# Patient Record
Sex: Male | Born: 1940
Health system: Southern US, Community
[De-identification: ages and names within clinical notes are randomized; demographics above are authoritative.]

## PROBLEM LIST (undated history)

## (undated) DIAGNOSIS — R51 Headache: Secondary | ICD-10-CM

## (undated) DIAGNOSIS — E785 Hyperlipidemia, unspecified: Secondary | ICD-10-CM

## (undated) DIAGNOSIS — R519 Headache, unspecified: Secondary | ICD-10-CM

## (undated) DIAGNOSIS — C189 Malignant neoplasm of colon, unspecified: Secondary | ICD-10-CM

## (undated) DIAGNOSIS — C801 Malignant (primary) neoplasm, unspecified: Secondary | ICD-10-CM

## (undated) DIAGNOSIS — D329 Benign neoplasm of meninges, unspecified: Secondary | ICD-10-CM

## (undated) HISTORY — DX: Headache: R51

## (undated) HISTORY — DX: Malignant (primary) neoplasm, unspecified: C80.1

## (undated) HISTORY — DX: Headache, unspecified: R51.9

## (undated) HISTORY — DX: Benign neoplasm of meninges, unspecified: D32.9

## (undated) HISTORY — DX: Malignant neoplasm of colon, unspecified: C18.9

## (undated) HISTORY — PX: EYE SURGERY: SHX253

## (undated) HISTORY — DX: Hyperlipidemia, unspecified: E78.5

---

## 1998-08-27 ENCOUNTER — Ambulatory Visit (HOSPITAL_COMMUNITY): Admission: RE | Admit: 1998-08-27 | Discharge: 1998-08-27 | Payer: Self-pay | Admitting: Family Medicine

## 1998-08-27 ENCOUNTER — Encounter: Payer: Self-pay | Admitting: Family Medicine

## 1998-09-25 ENCOUNTER — Ambulatory Visit (HOSPITAL_COMMUNITY): Admission: RE | Admit: 1998-09-25 | Discharge: 1998-09-25 | Payer: Self-pay | Admitting: Gastroenterology

## 1998-09-25 ENCOUNTER — Encounter: Payer: Self-pay | Admitting: Gastroenterology

## 1998-12-08 HISTORY — PX: LIVER RESECTION: SHX1977

## 1998-12-08 HISTORY — PX: COLON RESECTION: SHX5231

## 1998-12-12 ENCOUNTER — Ambulatory Visit (HOSPITAL_COMMUNITY): Admission: RE | Admit: 1998-12-12 | Discharge: 1998-12-12 | Payer: Self-pay | Admitting: Gastroenterology

## 1998-12-20 ENCOUNTER — Ambulatory Visit (HOSPITAL_COMMUNITY): Admission: RE | Admit: 1998-12-20 | Discharge: 1998-12-20 | Payer: Self-pay | Admitting: Gastroenterology

## 1998-12-20 ENCOUNTER — Encounter: Payer: Self-pay | Admitting: Gastroenterology

## 1999-09-03 ENCOUNTER — Emergency Department (HOSPITAL_COMMUNITY): Admission: EM | Admit: 1999-09-03 | Discharge: 1999-09-03 | Payer: Self-pay | Admitting: Emergency Medicine

## 1999-09-03 ENCOUNTER — Encounter: Payer: Self-pay | Admitting: Emergency Medicine

## 2003-12-09 HISTORY — PX: INSERTION PROSTATE RADIATION SEED: SUR718

## 2009-11-03 ENCOUNTER — Emergency Department (HOSPITAL_BASED_OUTPATIENT_CLINIC_OR_DEPARTMENT_OTHER): Admission: EM | Admit: 2009-11-03 | Discharge: 2009-11-03 | Payer: Self-pay | Admitting: Emergency Medicine

## 2009-11-03 ENCOUNTER — Ambulatory Visit: Payer: Self-pay | Admitting: Diagnostic Radiology

## 2009-11-12 ENCOUNTER — Encounter: Admission: RE | Admit: 2009-11-12 | Discharge: 2009-11-12 | Payer: Self-pay | Admitting: Family Medicine

## 2009-11-19 ENCOUNTER — Encounter: Admission: RE | Admit: 2009-11-19 | Discharge: 2009-11-19 | Payer: Self-pay | Admitting: Family Medicine

## 2010-08-27 IMAGING — CT CT ANGIO CHEST
2 of 7 series · 19 of 36 positions shown · IV contrast (CONTRAST)
Comparison: Chest x-ray 11/03/2009

CLINICAL DATA: Elevated D-dimer with chest pain.  History of
cancer.

CT ANGIOGRAPHY CHEST WITH CONTRAST
TECHNIQUE: Multidetector CT imaging of the chest was performed
using the standard protocol during bolus administration of
intravenous contrast. Multiplanar CT image reconstructions
including MIPs were obtained to evaluate the vascular anatomy.
Contrast: 150 ml Omnipaque 350

[Series 6: thins for pacs · axial · 0.89mm/px · z∈[+789,+1112]mm · 18 of 363 slices shown]
[im 20/363  lung]
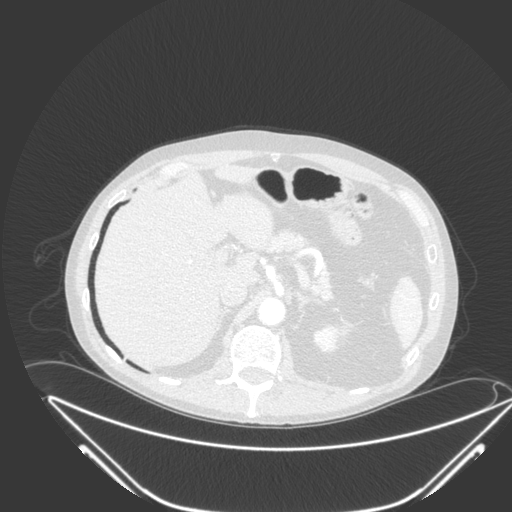
[im 39/363  mediastinal]
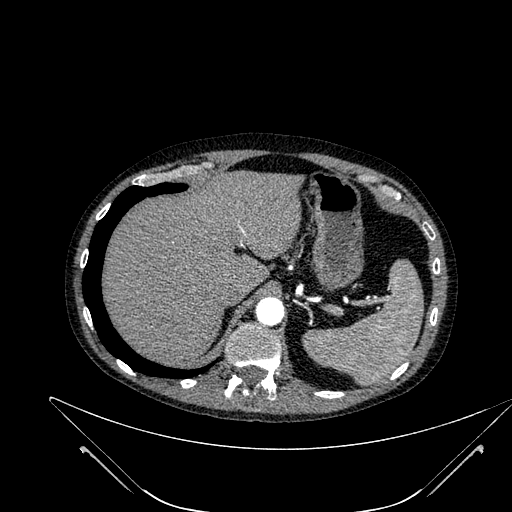
[im 58/363  lung]
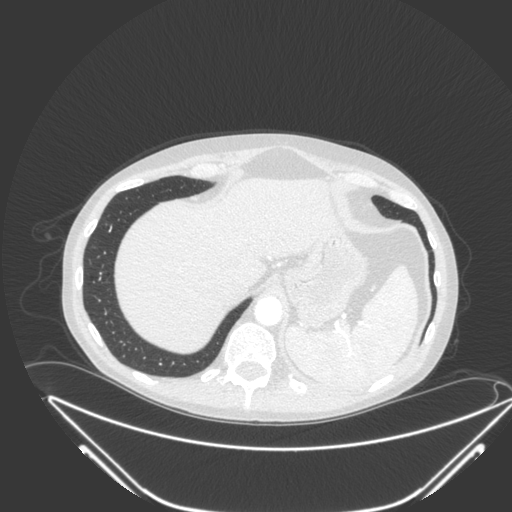
[im 77/363  mediastinal]
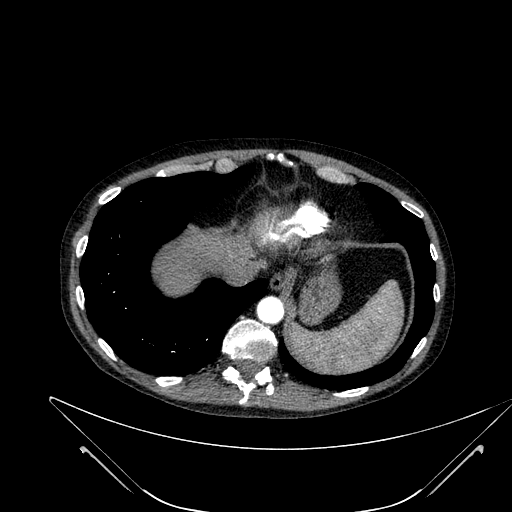
[im 96/363  lung]
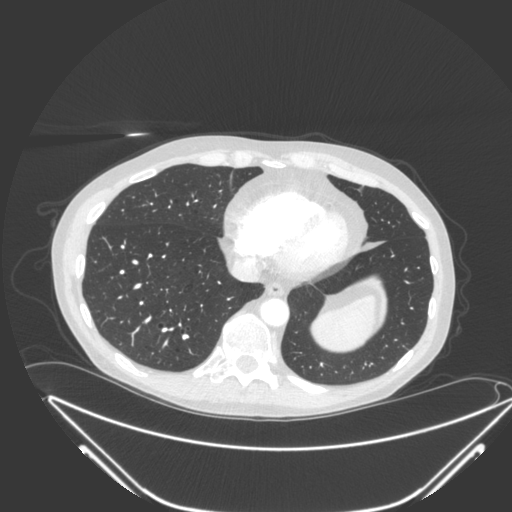
[im 115/363  mediastinal]
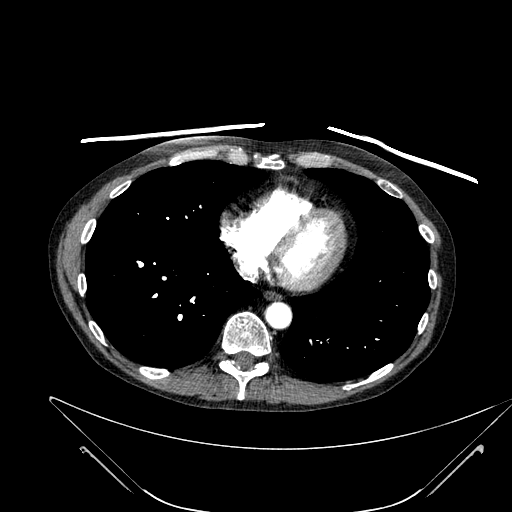
[im 134/363  lung]
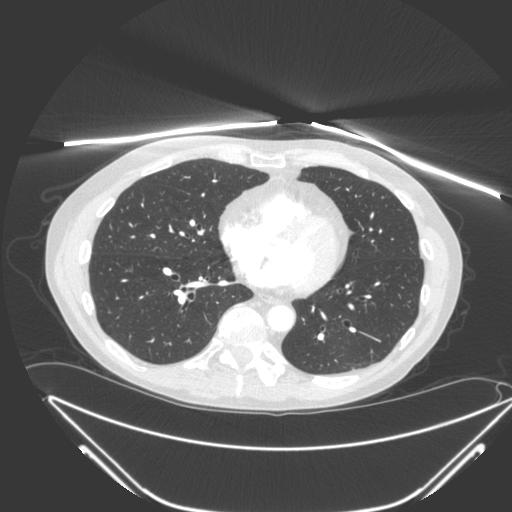
[im 153/363  mediastinal]
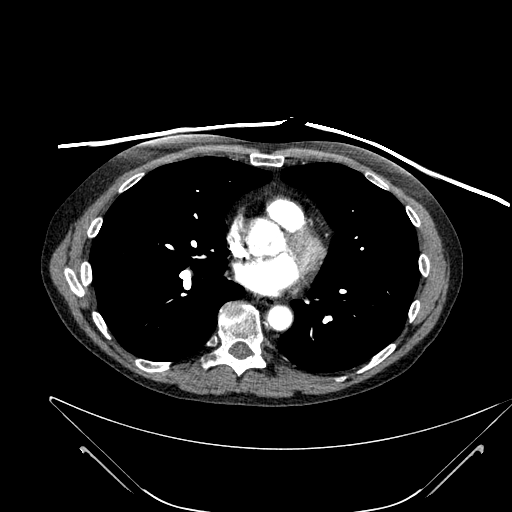
[im 172/363  lung]
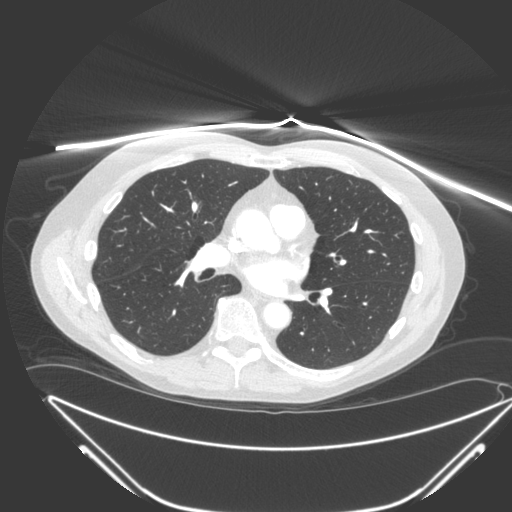
[im 191/363  mediastinal]
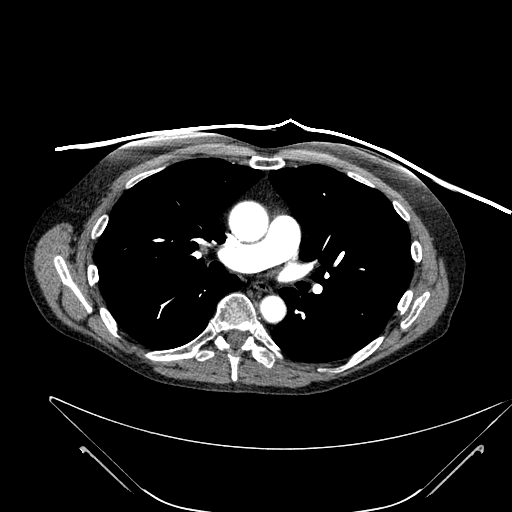
[im 210/363  lung]
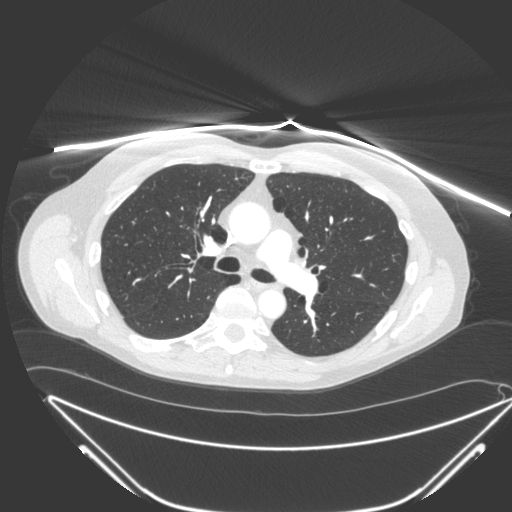
[im 229/363  mediastinal]
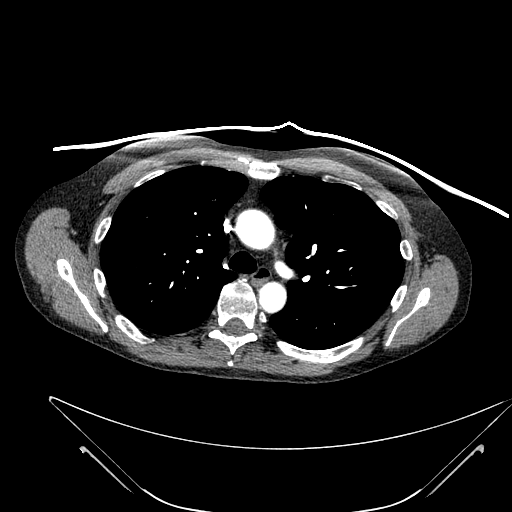
[im 248/363  lung]
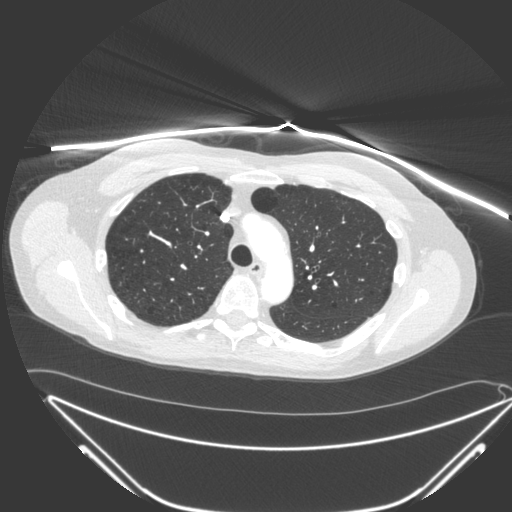
[im 267/363  mediastinal]
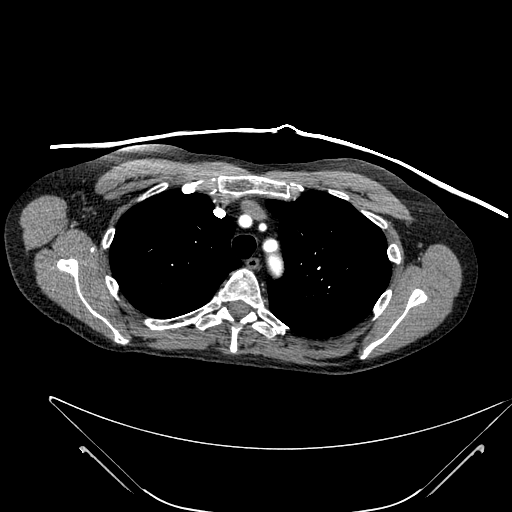
[im 286/363  lung]
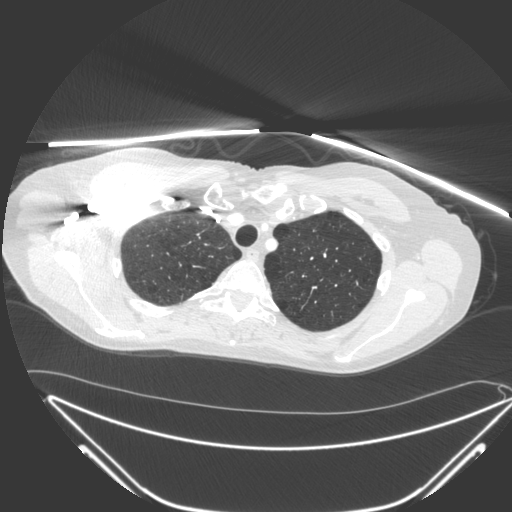
[im 305/363  mediastinal]
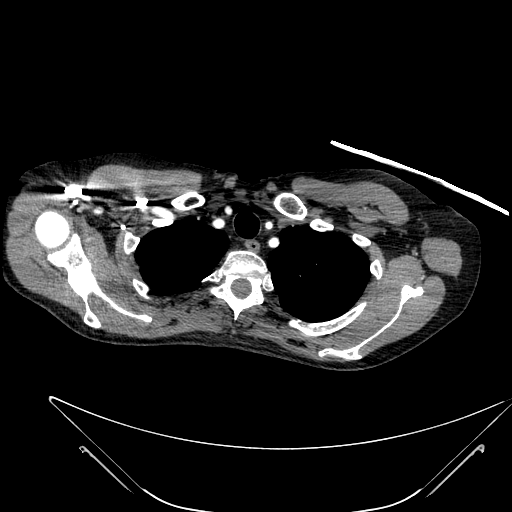
[im 324/363  lung]
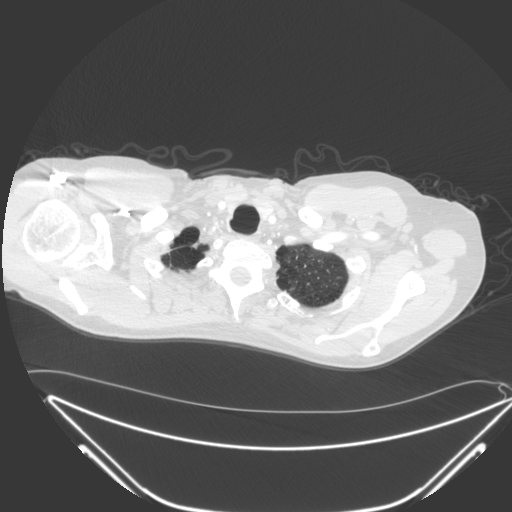
[im 343/363  mediastinal]
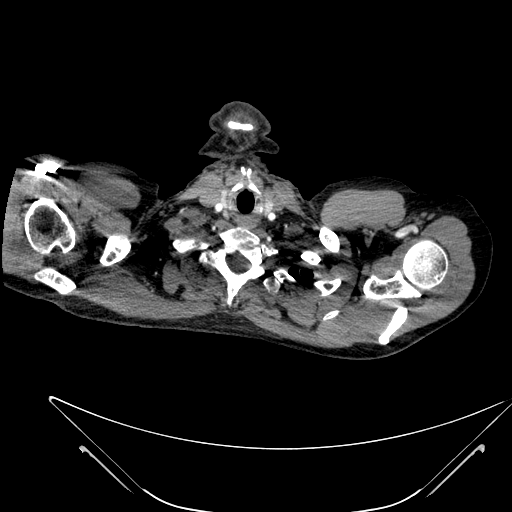

[mpr coronals · coronal · 0.89mm/px · 1 of 96 slices shown]
[im 48/96  mediastinal]
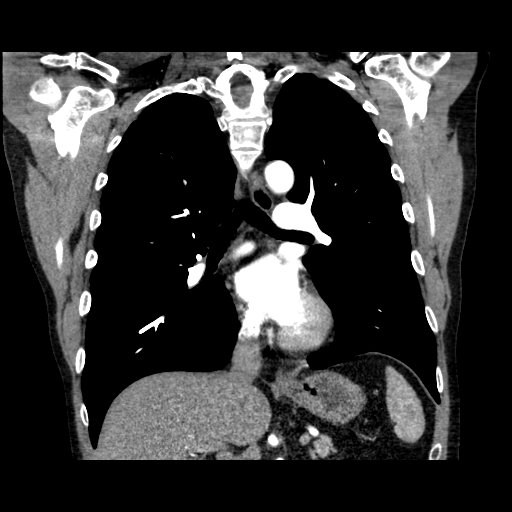

[19 of 36 positions shown; findings below may reference images not displayed]

FINDINGS: No pulmonary embolus.  No pathologically enlarged
mediastinal, hilar or axillary lymph nodes.  Heart size normal.  No
pericardial effusion.

Paraseptal and centrilobular emphysema.  There are a few scattered
areas of subpleural scarring.  Lungs are otherwise clear.  No
pleural fluid.  Airway is unremarkable.

Incidental imaging of the upper abdomen shows a possible 2 cm stone
in the neck of the gallbladder, which is incompletely imaged.  No
worrisome lytic or sclerotic lesions.

Review of the MIP images confirms the above findings.
IMPRESSION: 1.  No pulmonary embolus.
2.  No CT findings to explain the patient's chest pain.

## 2010-09-03 IMAGING — US US ABDOMEN COMPLETE
1 series · 14 of 25 positions shown · non-contrast
Comparison: None.

CLINICAL DATA: Elevated liver function tests.

COMPLETE ABDOMINAL ULTRASOUND

[Series 1: us abdomen complete · 0.28mm/px · 14 of 75 slices shown]
[im 1/75]
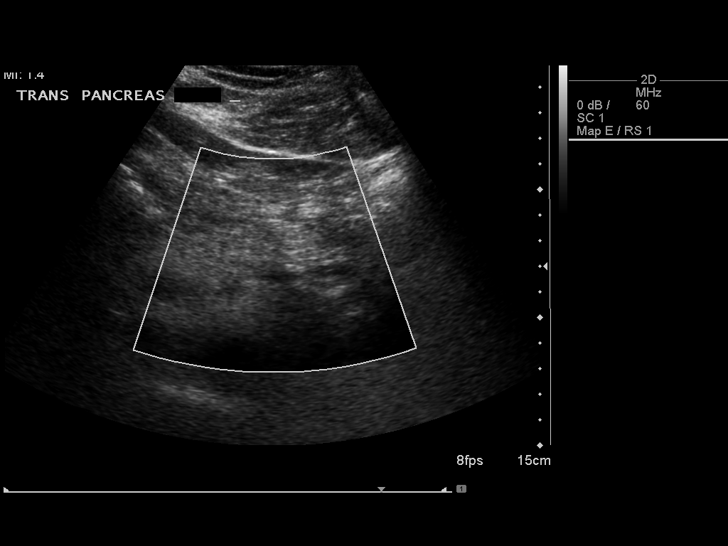
[im 7/75]
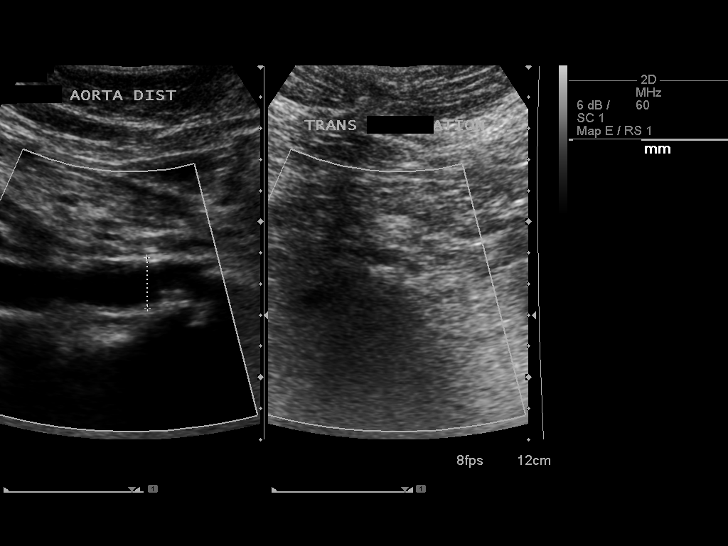
[im 13/75]
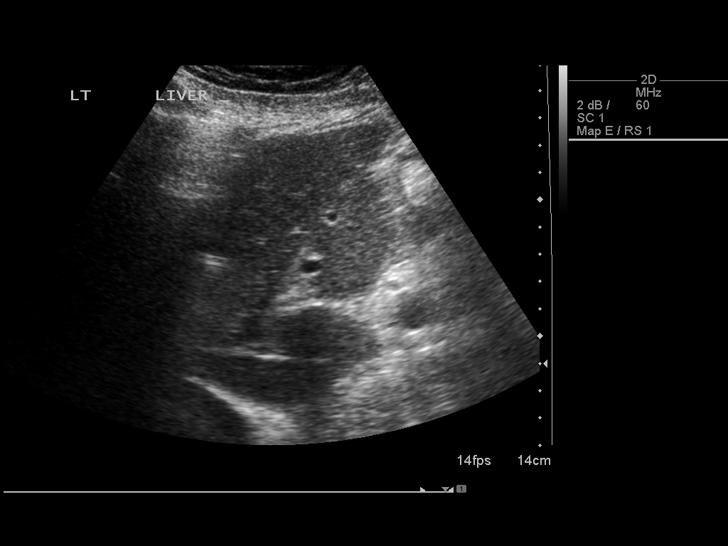
[im 19/75]
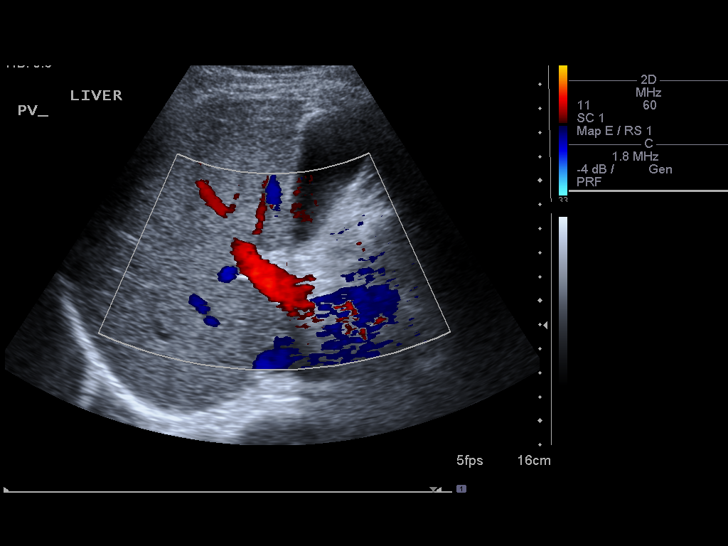
[im 25/75]
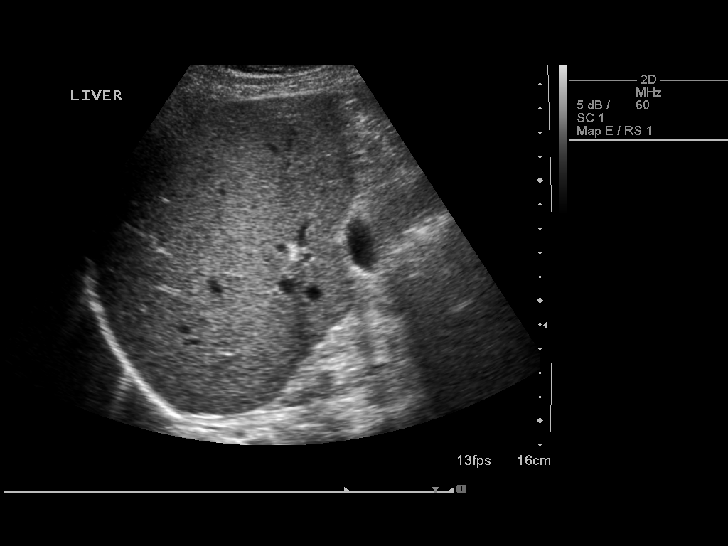
[im 28/75]
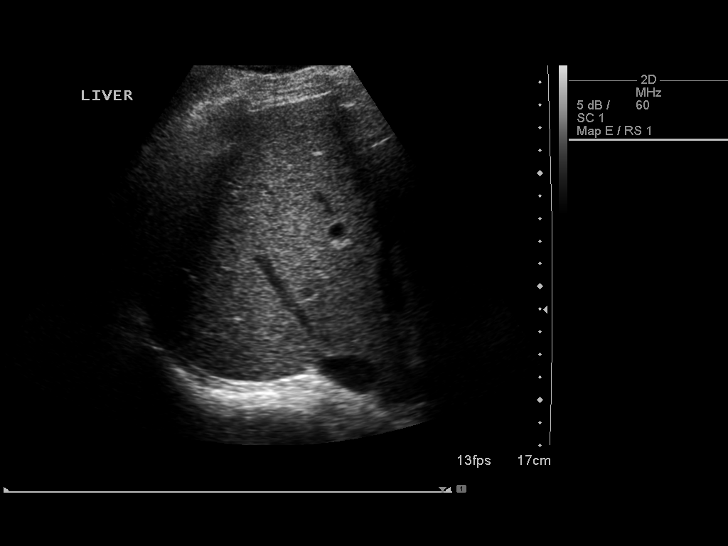
[im 34/75]
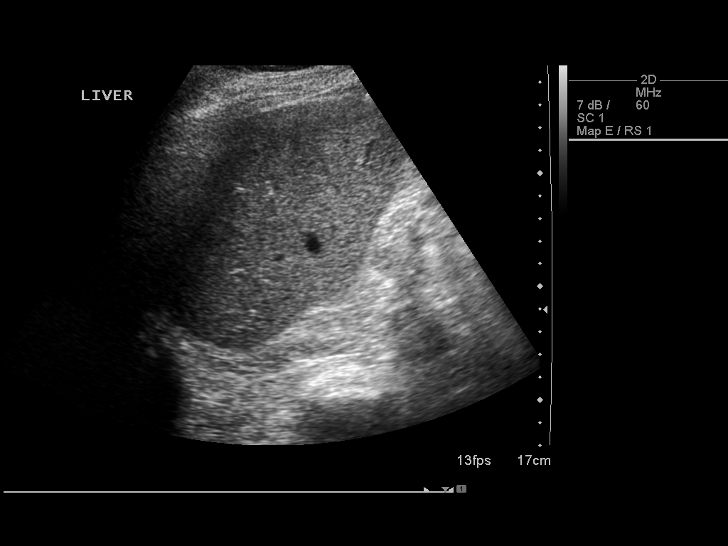
[im 41/75]
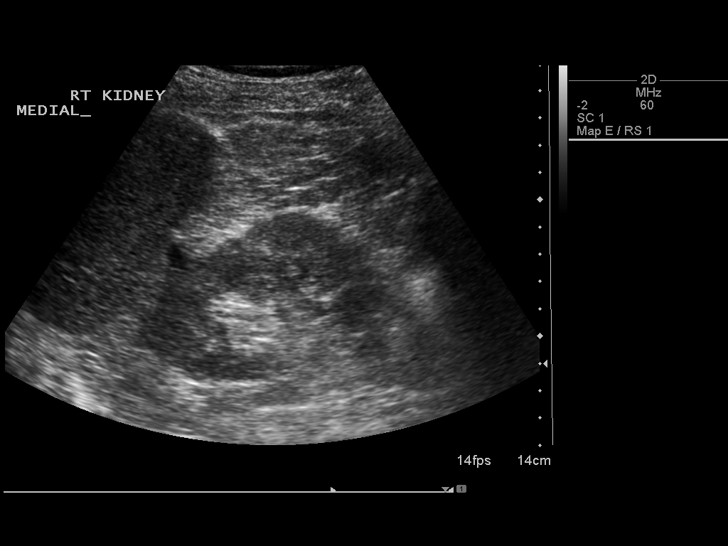
[im 47/75]
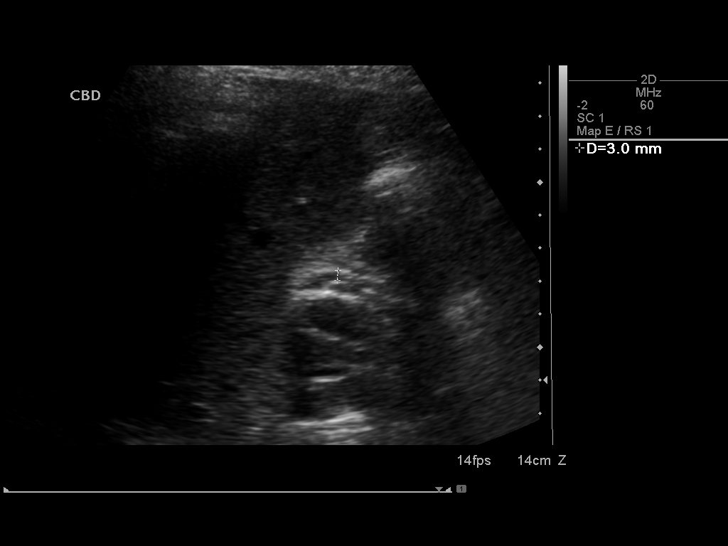
[im 50/75]
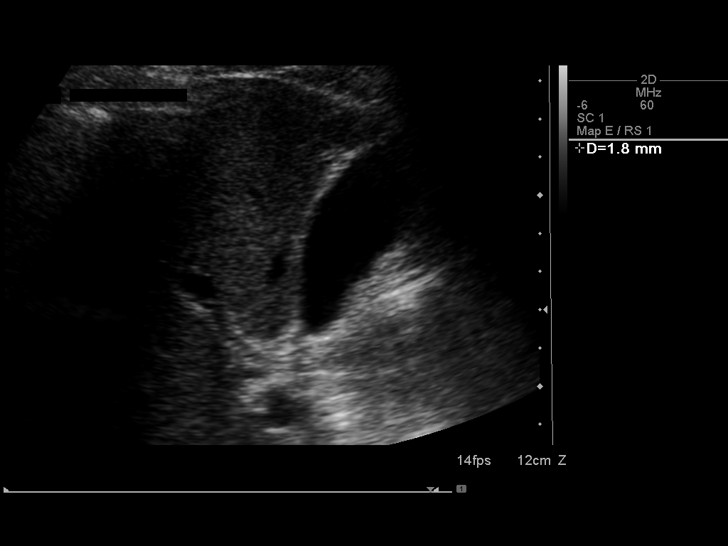
[im 56/75]
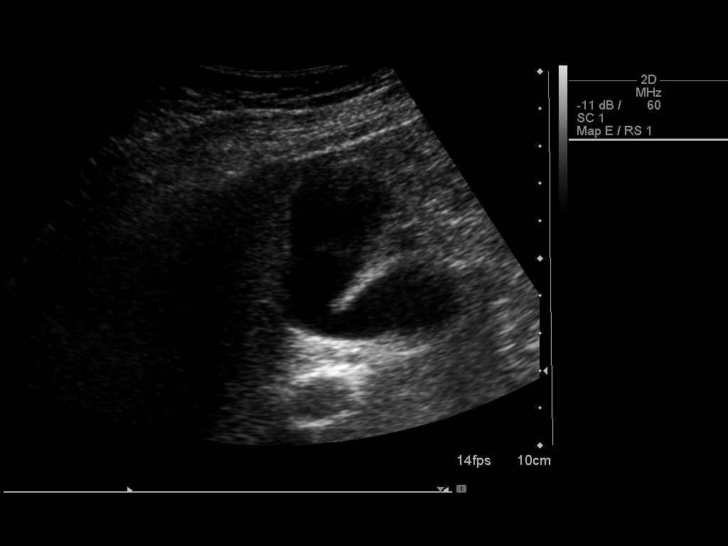
[im 62/75]
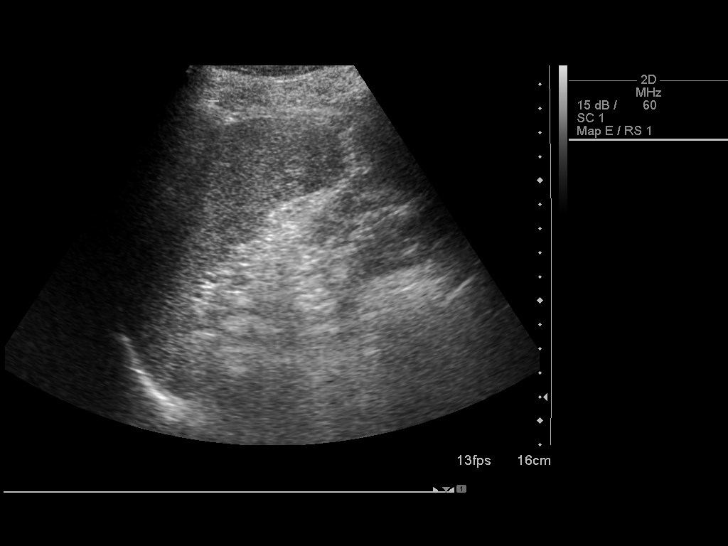
[im 68/75]
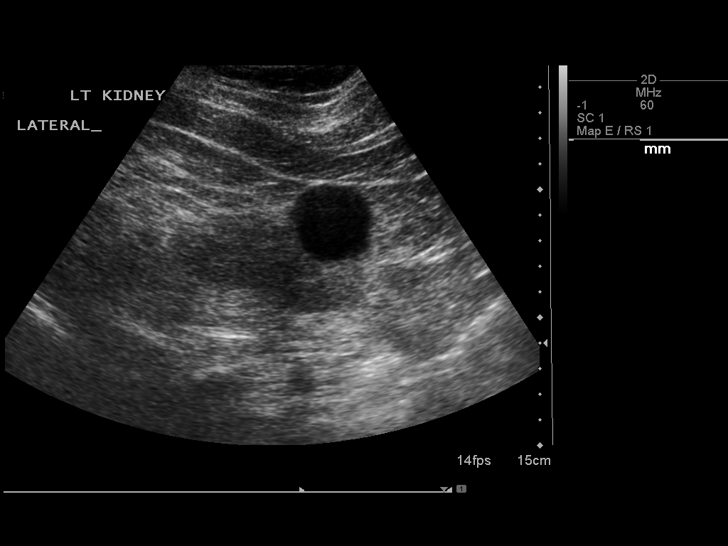
[im 75/75]
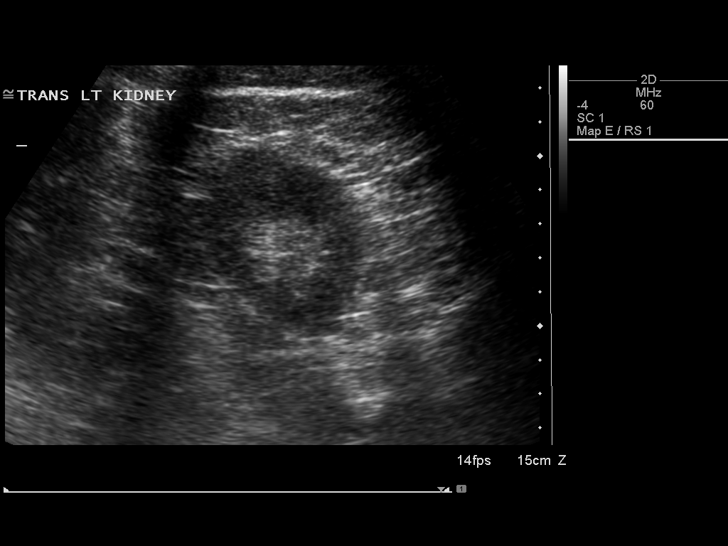

[14 of 25 positions shown; findings below may reference images not displayed]

FINDINGS: Gallbladder:  Negative.

Common bile duct:  Measures 3 mm, within normal limits.

Liver:  Negative.

IVC:  Visualized.

Pancreas:  Poorly visualized due to obscuration by bowel gas.

Spleen:  Measures 8 cm, negative.

Right Kidney:  Measures 10.3 cm.  Contains a 1.9 x 1.3 x 1.1 cm
anechoic or nearly anechoic lesion with increased through
transmission in the upper pole, consistent with a cyst. Mild
internal complexity is considered.

Left Kidney:  Measures 11.9 cm.  Contains a 3.3 x 3.0 x 2.8 cm cyst
in the lower pole.

Abdominal aorta:  Atherosclerotic.  Maximum diameter is 2.8 cm.
IMPRESSION: No acute findings.

## 2011-03-12 LAB — CBC
HCT: 43.5 % (ref 39.0–52.0)
Hemoglobin: 14.9 g/dL (ref 13.0–17.0)
MCHC: 34.2 g/dL (ref 30.0–36.0)
MCV: 92 fL (ref 78.0–100.0)
RDW: 12.4 % (ref 11.5–15.5)

## 2011-03-12 LAB — COMPREHENSIVE METABOLIC PANEL
Alkaline Phosphatase: 105 U/L (ref 39–117)
BUN: 19 mg/dL (ref 6–23)
Calcium: 9.4 mg/dL (ref 8.4–10.5)
Creatinine, Ser: 0.9 mg/dL (ref 0.4–1.5)
Glucose, Bld: 85 mg/dL (ref 70–99)
Total Protein: 7.2 g/dL (ref 6.0–8.3)

## 2011-03-12 LAB — DIFFERENTIAL
Basophils Relative: 5 % — ABNORMAL HIGH (ref 0–1)
Lymphocytes Relative: 18 % (ref 12–46)
Lymphs Abs: 1.2 10*3/uL (ref 0.7–4.0)
Monocytes Relative: 9 % (ref 3–12)
Neutro Abs: 4.4 10*3/uL (ref 1.7–7.7)
Neutrophils Relative %: 65 % (ref 43–77)

## 2011-03-12 LAB — PROTIME-INR
INR: 0.97 (ref 0.00–1.49)
Prothrombin Time: 12.8 seconds (ref 11.6–15.2)

## 2011-03-12 LAB — POCT CARDIAC MARKERS: Myoglobin, poc: 47.1 ng/mL (ref 12–200)

## 2011-12-15 DIAGNOSIS — M5137 Other intervertebral disc degeneration, lumbosacral region: Secondary | ICD-10-CM | POA: Diagnosis not present

## 2011-12-15 DIAGNOSIS — IMO0002 Reserved for concepts with insufficient information to code with codable children: Secondary | ICD-10-CM | POA: Diagnosis not present

## 2011-12-15 DIAGNOSIS — M999 Biomechanical lesion, unspecified: Secondary | ICD-10-CM | POA: Diagnosis not present

## 2011-12-15 DIAGNOSIS — M5430 Sciatica, unspecified side: Secondary | ICD-10-CM | POA: Diagnosis not present

## 2011-12-16 DIAGNOSIS — M5430 Sciatica, unspecified side: Secondary | ICD-10-CM | POA: Diagnosis not present

## 2011-12-16 DIAGNOSIS — IMO0002 Reserved for concepts with insufficient information to code with codable children: Secondary | ICD-10-CM | POA: Diagnosis not present

## 2011-12-16 DIAGNOSIS — M5137 Other intervertebral disc degeneration, lumbosacral region: Secondary | ICD-10-CM | POA: Diagnosis not present

## 2011-12-16 DIAGNOSIS — M999 Biomechanical lesion, unspecified: Secondary | ICD-10-CM | POA: Diagnosis not present

## 2011-12-17 DIAGNOSIS — M999 Biomechanical lesion, unspecified: Secondary | ICD-10-CM | POA: Diagnosis not present

## 2011-12-17 DIAGNOSIS — IMO0002 Reserved for concepts with insufficient information to code with codable children: Secondary | ICD-10-CM | POA: Diagnosis not present

## 2011-12-17 DIAGNOSIS — M5137 Other intervertebral disc degeneration, lumbosacral region: Secondary | ICD-10-CM | POA: Diagnosis not present

## 2011-12-17 DIAGNOSIS — M5430 Sciatica, unspecified side: Secondary | ICD-10-CM | POA: Diagnosis not present

## 2011-12-19 DIAGNOSIS — M5137 Other intervertebral disc degeneration, lumbosacral region: Secondary | ICD-10-CM | POA: Diagnosis not present

## 2011-12-19 DIAGNOSIS — M999 Biomechanical lesion, unspecified: Secondary | ICD-10-CM | POA: Diagnosis not present

## 2011-12-19 DIAGNOSIS — M5430 Sciatica, unspecified side: Secondary | ICD-10-CM | POA: Diagnosis not present

## 2011-12-19 DIAGNOSIS — IMO0002 Reserved for concepts with insufficient information to code with codable children: Secondary | ICD-10-CM | POA: Diagnosis not present

## 2011-12-24 DIAGNOSIS — M999 Biomechanical lesion, unspecified: Secondary | ICD-10-CM | POA: Diagnosis not present

## 2011-12-24 DIAGNOSIS — M5137 Other intervertebral disc degeneration, lumbosacral region: Secondary | ICD-10-CM | POA: Diagnosis not present

## 2011-12-24 DIAGNOSIS — IMO0002 Reserved for concepts with insufficient information to code with codable children: Secondary | ICD-10-CM | POA: Diagnosis not present

## 2011-12-24 DIAGNOSIS — M5430 Sciatica, unspecified side: Secondary | ICD-10-CM | POA: Diagnosis not present

## 2012-01-05 DIAGNOSIS — M999 Biomechanical lesion, unspecified: Secondary | ICD-10-CM | POA: Diagnosis not present

## 2012-01-05 DIAGNOSIS — M5430 Sciatica, unspecified side: Secondary | ICD-10-CM | POA: Diagnosis not present

## 2012-01-05 DIAGNOSIS — M5137 Other intervertebral disc degeneration, lumbosacral region: Secondary | ICD-10-CM | POA: Diagnosis not present

## 2012-01-05 DIAGNOSIS — IMO0002 Reserved for concepts with insufficient information to code with codable children: Secondary | ICD-10-CM | POA: Diagnosis not present

## 2012-01-20 DIAGNOSIS — R5381 Other malaise: Secondary | ICD-10-CM | POA: Diagnosis not present

## 2012-01-20 DIAGNOSIS — E785 Hyperlipidemia, unspecified: Secondary | ICD-10-CM | POA: Diagnosis not present

## 2012-01-20 DIAGNOSIS — G479 Sleep disorder, unspecified: Secondary | ICD-10-CM | POA: Diagnosis not present

## 2012-01-20 DIAGNOSIS — Z1331 Encounter for screening for depression: Secondary | ICD-10-CM | POA: Diagnosis not present

## 2012-02-04 DIAGNOSIS — C61 Malignant neoplasm of prostate: Secondary | ICD-10-CM | POA: Diagnosis not present

## 2012-05-04 DIAGNOSIS — D485 Neoplasm of uncertain behavior of skin: Secondary | ICD-10-CM | POA: Diagnosis not present

## 2012-05-04 DIAGNOSIS — Z85828 Personal history of other malignant neoplasm of skin: Secondary | ICD-10-CM | POA: Diagnosis not present

## 2012-05-04 DIAGNOSIS — D235 Other benign neoplasm of skin of trunk: Secondary | ICD-10-CM | POA: Diagnosis not present

## 2012-05-04 DIAGNOSIS — L570 Actinic keratosis: Secondary | ICD-10-CM | POA: Diagnosis not present

## 2012-05-12 DIAGNOSIS — M5430 Sciatica, unspecified side: Secondary | ICD-10-CM | POA: Diagnosis not present

## 2012-05-12 DIAGNOSIS — M999 Biomechanical lesion, unspecified: Secondary | ICD-10-CM | POA: Diagnosis not present

## 2012-05-12 DIAGNOSIS — IMO0002 Reserved for concepts with insufficient information to code with codable children: Secondary | ICD-10-CM | POA: Diagnosis not present

## 2012-05-12 DIAGNOSIS — M5137 Other intervertebral disc degeneration, lumbosacral region: Secondary | ICD-10-CM | POA: Diagnosis not present

## 2012-07-19 DIAGNOSIS — E785 Hyperlipidemia, unspecified: Secondary | ICD-10-CM | POA: Diagnosis not present

## 2012-08-06 DIAGNOSIS — M999 Biomechanical lesion, unspecified: Secondary | ICD-10-CM | POA: Diagnosis not present

## 2012-08-06 DIAGNOSIS — M5137 Other intervertebral disc degeneration, lumbosacral region: Secondary | ICD-10-CM | POA: Diagnosis not present

## 2012-08-06 DIAGNOSIS — IMO0002 Reserved for concepts with insufficient information to code with codable children: Secondary | ICD-10-CM | POA: Diagnosis not present

## 2012-08-06 DIAGNOSIS — M5430 Sciatica, unspecified side: Secondary | ICD-10-CM | POA: Diagnosis not present

## 2012-09-08 DIAGNOSIS — H25019 Cortical age-related cataract, unspecified eye: Secondary | ICD-10-CM | POA: Diagnosis not present

## 2012-09-08 DIAGNOSIS — H2510 Age-related nuclear cataract, unspecified eye: Secondary | ICD-10-CM | POA: Diagnosis not present

## 2012-09-15 DIAGNOSIS — IMO0002 Reserved for concepts with insufficient information to code with codable children: Secondary | ICD-10-CM | POA: Diagnosis not present

## 2012-09-15 DIAGNOSIS — M5430 Sciatica, unspecified side: Secondary | ICD-10-CM | POA: Diagnosis not present

## 2012-09-15 DIAGNOSIS — M999 Biomechanical lesion, unspecified: Secondary | ICD-10-CM | POA: Diagnosis not present

## 2012-09-15 DIAGNOSIS — M5137 Other intervertebral disc degeneration, lumbosacral region: Secondary | ICD-10-CM | POA: Diagnosis not present

## 2012-12-29 DIAGNOSIS — G479 Sleep disorder, unspecified: Secondary | ICD-10-CM | POA: Diagnosis not present

## 2012-12-29 DIAGNOSIS — Z1211 Encounter for screening for malignant neoplasm of colon: Secondary | ICD-10-CM | POA: Diagnosis not present

## 2012-12-29 DIAGNOSIS — Z23 Encounter for immunization: Secondary | ICD-10-CM | POA: Diagnosis not present

## 2012-12-29 DIAGNOSIS — E785 Hyperlipidemia, unspecified: Secondary | ICD-10-CM | POA: Diagnosis not present

## 2013-01-11 DIAGNOSIS — Z85828 Personal history of other malignant neoplasm of skin: Secondary | ICD-10-CM | POA: Diagnosis not present

## 2013-01-11 DIAGNOSIS — C44319 Basal cell carcinoma of skin of other parts of face: Secondary | ICD-10-CM | POA: Diagnosis not present

## 2013-01-11 DIAGNOSIS — L570 Actinic keratosis: Secondary | ICD-10-CM | POA: Diagnosis not present

## 2013-01-11 DIAGNOSIS — L820 Inflamed seborrheic keratosis: Secondary | ICD-10-CM | POA: Diagnosis not present

## 2013-01-14 DIAGNOSIS — M5430 Sciatica, unspecified side: Secondary | ICD-10-CM | POA: Diagnosis not present

## 2013-01-14 DIAGNOSIS — IMO0002 Reserved for concepts with insufficient information to code with codable children: Secondary | ICD-10-CM | POA: Diagnosis not present

## 2013-01-14 DIAGNOSIS — M999 Biomechanical lesion, unspecified: Secondary | ICD-10-CM | POA: Diagnosis not present

## 2013-01-14 DIAGNOSIS — M5137 Other intervertebral disc degeneration, lumbosacral region: Secondary | ICD-10-CM | POA: Diagnosis not present

## 2013-02-08 DIAGNOSIS — Z980 Intestinal bypass and anastomosis status: Secondary | ICD-10-CM | POA: Diagnosis not present

## 2013-02-08 DIAGNOSIS — D126 Benign neoplasm of colon, unspecified: Secondary | ICD-10-CM | POA: Diagnosis not present

## 2013-02-08 DIAGNOSIS — D129 Benign neoplasm of anus and anal canal: Secondary | ICD-10-CM | POA: Diagnosis not present

## 2013-02-08 DIAGNOSIS — Z09 Encounter for follow-up examination after completed treatment for conditions other than malignant neoplasm: Secondary | ICD-10-CM | POA: Diagnosis not present

## 2013-02-08 DIAGNOSIS — K5730 Diverticulosis of large intestine without perforation or abscess without bleeding: Secondary | ICD-10-CM | POA: Diagnosis not present

## 2013-02-08 DIAGNOSIS — D128 Benign neoplasm of rectum: Secondary | ICD-10-CM | POA: Diagnosis not present

## 2013-02-08 DIAGNOSIS — Z85038 Personal history of other malignant neoplasm of large intestine: Secondary | ICD-10-CM | POA: Diagnosis not present

## 2013-02-08 DIAGNOSIS — K648 Other hemorrhoids: Secondary | ICD-10-CM | POA: Diagnosis not present

## 2013-02-21 DIAGNOSIS — C61 Malignant neoplasm of prostate: Secondary | ICD-10-CM | POA: Diagnosis not present

## 2013-02-21 DIAGNOSIS — N529 Male erectile dysfunction, unspecified: Secondary | ICD-10-CM | POA: Diagnosis not present

## 2013-06-29 DIAGNOSIS — Z79899 Other long term (current) drug therapy: Secondary | ICD-10-CM | POA: Diagnosis not present

## 2013-06-29 DIAGNOSIS — E785 Hyperlipidemia, unspecified: Secondary | ICD-10-CM | POA: Diagnosis not present

## 2013-07-12 DIAGNOSIS — L570 Actinic keratosis: Secondary | ICD-10-CM | POA: Diagnosis not present

## 2013-07-12 DIAGNOSIS — Z85828 Personal history of other malignant neoplasm of skin: Secondary | ICD-10-CM | POA: Diagnosis not present

## 2013-08-29 DIAGNOSIS — N4340 Spermatocele of epididymis, unspecified: Secondary | ICD-10-CM | POA: Diagnosis not present

## 2013-08-29 DIAGNOSIS — N320 Bladder-neck obstruction: Secondary | ICD-10-CM | POA: Diagnosis not present

## 2013-08-29 DIAGNOSIS — Z8546 Personal history of malignant neoplasm of prostate: Secondary | ICD-10-CM | POA: Diagnosis not present

## 2013-08-29 DIAGNOSIS — N529 Male erectile dysfunction, unspecified: Secondary | ICD-10-CM | POA: Diagnosis not present

## 2013-09-21 DIAGNOSIS — Z1331 Encounter for screening for depression: Secondary | ICD-10-CM | POA: Diagnosis not present

## 2013-09-21 DIAGNOSIS — Z23 Encounter for immunization: Secondary | ICD-10-CM | POA: Diagnosis not present

## 2013-09-21 DIAGNOSIS — G479 Sleep disorder, unspecified: Secondary | ICD-10-CM | POA: Diagnosis not present

## 2013-09-21 DIAGNOSIS — E785 Hyperlipidemia, unspecified: Secondary | ICD-10-CM | POA: Diagnosis not present

## 2013-11-15 DIAGNOSIS — Z85828 Personal history of other malignant neoplasm of skin: Secondary | ICD-10-CM | POA: Diagnosis not present

## 2013-11-15 DIAGNOSIS — L570 Actinic keratosis: Secondary | ICD-10-CM | POA: Diagnosis not present

## 2013-11-15 DIAGNOSIS — C4441 Basal cell carcinoma of skin of scalp and neck: Secondary | ICD-10-CM | POA: Diagnosis not present

## 2014-03-21 DIAGNOSIS — Z85828 Personal history of other malignant neoplasm of skin: Secondary | ICD-10-CM | POA: Diagnosis not present

## 2014-03-21 DIAGNOSIS — L570 Actinic keratosis: Secondary | ICD-10-CM | POA: Diagnosis not present

## 2014-03-22 DIAGNOSIS — Z23 Encounter for immunization: Secondary | ICD-10-CM | POA: Diagnosis not present

## 2014-03-22 DIAGNOSIS — E785 Hyperlipidemia, unspecified: Secondary | ICD-10-CM | POA: Diagnosis not present

## 2014-03-22 DIAGNOSIS — R252 Cramp and spasm: Secondary | ICD-10-CM | POA: Diagnosis not present

## 2014-03-22 DIAGNOSIS — G479 Sleep disorder, unspecified: Secondary | ICD-10-CM | POA: Diagnosis not present

## 2014-06-24 DIAGNOSIS — IMO0002 Reserved for concepts with insufficient information to code with codable children: Secondary | ICD-10-CM | POA: Diagnosis not present

## 2014-06-30 ENCOUNTER — Ambulatory Visit
Admission: RE | Admit: 2014-06-30 | Discharge: 2014-06-30 | Disposition: A | Discharge status: Home / Self Care | Payer: Medicare Other | Source: Ambulatory Visit | Attending: Family Medicine | Admitting: Family Medicine

## 2014-06-30 ENCOUNTER — Other Ambulatory Visit: Payer: Self-pay | Admitting: Family Medicine

## 2014-06-30 DIAGNOSIS — R0600 Dyspnea, unspecified: Secondary | ICD-10-CM

## 2014-06-30 DIAGNOSIS — T7840XA Allergy, unspecified, initial encounter: Secondary | ICD-10-CM | POA: Diagnosis not present

## 2014-06-30 DIAGNOSIS — R0602 Shortness of breath: Secondary | ICD-10-CM | POA: Diagnosis not present

## 2014-06-30 DIAGNOSIS — IMO0002 Reserved for concepts with insufficient information to code with codable children: Secondary | ICD-10-CM | POA: Diagnosis not present

## 2014-06-30 DIAGNOSIS — R5383 Other fatigue: Secondary | ICD-10-CM | POA: Diagnosis not present

## 2014-06-30 DIAGNOSIS — R5381 Other malaise: Secondary | ICD-10-CM | POA: Diagnosis not present

## 2014-07-14 DIAGNOSIS — G609 Hereditary and idiopathic neuropathy, unspecified: Secondary | ICD-10-CM | POA: Diagnosis not present

## 2014-07-14 DIAGNOSIS — B9789 Other viral agents as the cause of diseases classified elsewhere: Secondary | ICD-10-CM | POA: Diagnosis not present

## 2014-07-27 DIAGNOSIS — T783XXA Angioneurotic edema, initial encounter: Secondary | ICD-10-CM | POA: Diagnosis not present

## 2014-07-28 DIAGNOSIS — R0602 Shortness of breath: Secondary | ICD-10-CM | POA: Diagnosis not present

## 2014-07-28 DIAGNOSIS — R5381 Other malaise: Secondary | ICD-10-CM | POA: Diagnosis not present

## 2014-07-28 DIAGNOSIS — T783XXA Angioneurotic edema, initial encounter: Secondary | ICD-10-CM | POA: Diagnosis not present

## 2014-07-28 DIAGNOSIS — K219 Gastro-esophageal reflux disease without esophagitis: Secondary | ICD-10-CM | POA: Diagnosis not present

## 2014-08-09 DIAGNOSIS — R21 Rash and other nonspecific skin eruption: Secondary | ICD-10-CM | POA: Diagnosis not present

## 2014-08-09 DIAGNOSIS — R0602 Shortness of breath: Secondary | ICD-10-CM | POA: Diagnosis not present

## 2014-08-09 DIAGNOSIS — R059 Cough, unspecified: Secondary | ICD-10-CM | POA: Diagnosis not present

## 2014-08-09 DIAGNOSIS — T783XXA Angioneurotic edema, initial encounter: Secondary | ICD-10-CM | POA: Diagnosis not present

## 2014-08-09 DIAGNOSIS — J309 Allergic rhinitis, unspecified: Secondary | ICD-10-CM | POA: Diagnosis not present

## 2014-08-09 DIAGNOSIS — R05 Cough: Secondary | ICD-10-CM | POA: Diagnosis not present

## 2014-08-15 DIAGNOSIS — R5381 Other malaise: Secondary | ICD-10-CM | POA: Diagnosis not present

## 2014-08-15 DIAGNOSIS — R5383 Other fatigue: Secondary | ICD-10-CM | POA: Diagnosis not present

## 2014-09-01 ENCOUNTER — Encounter: Payer: Self-pay | Admitting: Internal Medicine

## 2014-09-01 ENCOUNTER — Ambulatory Visit (INDEPENDENT_AMBULATORY_CARE_PROVIDER_SITE_OTHER): Payer: Medicare Other | Admitting: Internal Medicine

## 2014-09-01 VITALS — BP 149/93 | HR 77 | Ht 73.0 in | Wt 189.0 lb

## 2014-09-01 DIAGNOSIS — R0602 Shortness of breath: Secondary | ICD-10-CM

## 2014-09-01 DIAGNOSIS — R5381 Other malaise: Secondary | ICD-10-CM | POA: Diagnosis not present

## 2014-09-01 DIAGNOSIS — R5383 Other fatigue: Secondary | ICD-10-CM

## 2014-09-01 NOTE — Progress Notes (Signed)
HPI Patient referred for fatigue and SOB   Walking up stairs  In yard  Gets SOB  Started about 6 months ago. Was on prednisone for an allergic reaction  Felt better  Stopped a few days ago   Allergist didn't know what he is allergic to  Now he keeps epi pen Wt OK  Bowels moving OK   No PND Cramps all over body    Did have some pain in chest when laying flat  Given Rx for Pepcid Allergies  Allergen Reactions  . Lunesta [Eszopiclone] Other (See Comments)    Not effective    Current Outpatient Prescriptions  Medication Sig Dispense Refill  . atorvastatin (LIPITOR) 20 MG tablet Take 20 mg by mouth daily.      Marland Kitchen zolpidem (AMBIEN CR) 12.5 MG CR tablet Take 12.5 mg by mouth at bedtime as needed for sleep.       No current facility-administered medications for this visit.    No past medical history on file.  No past surgical history on file.  No family history on file.  History   Social History  . Marital Status: Single    Spouse Name: N/A    Number of Children: N/A  . Years of Education: N/A   Occupational History  . Not on file.   Social History Main Topics  . Smoking status: Former Research scientist (life sciences)  . Smokeless tobacco: Not on file  . Alcohol Use: Not on file  . Drug Use: Not on file  . Sexual Activity: Not on file   Other Topics Concern  . Not on file   Social History Narrative   Tobacco use cigarettes: Former smoker ,quit in year, 2000 , Pack year Hx 67 - Tobacco history 07/28/2014. No smoking quit 2000 Alcohol : yes , Rare beer No Recreational drug use Exercise active Occupation retired from [employer]. Marital Status  Married  Children 1 son [name] -Chiropractor  relgion : not important - Seat belt use : yes    Review of Systems:  All systems reviewed.  They are negative to the above problem except as previously stated.  Vital Signs: BP 149/93  Pulse 77  Ht 6\' 1"  (1.854 m)  Wt 189 lb (85.73 kg)  BMI 24.94 kg/m2  Physical Exam [place] denies CP    HEENT:  Normocephalic, atraumatic. EOMI, PERRLA.  Neck: JVP is normal.  No bruits.  Lungs: clear to auscultation. No rales no wheezes.  Heart: Regular rate and rhythm. Normal S1, S2. No S3.   No significant murmurs. PMI not displaced.  Abdomen:  Supple, nontender. Normal bowel sounds. No masses. No hepatomegaly.  Extremities:   Good distal pulses throughout. No lower extremity edema.  Musculoskeletal :moving all extremities.  Neuro:   alert and oriented x3.  CN II-XII grossly intact.  EKG  SR 77 First degree AV block   RBBB LPFB   Assessment and Plan:  Patient with lots of symptoms.  I am not convinced CP is cardiac  May be GI with reflux  Agree with trial of Pepcid He does have fatigue and SOB   Would set up for Stress myoview

## 2014-09-01 NOTE — Patient Instructions (Signed)
Your physician recommends that you continue on your current medications as directed. Please refer to the Current Medication list given to you today.  Your physician has requested that you have en exercise stress myoview. For further information please visit [email or web address]. Please follow instruction sheet, as given.   

## 2014-09-07 ENCOUNTER — Encounter (HOSPITAL_COMMUNITY): Payer: Medicare Other

## 2014-09-08 ENCOUNTER — Ambulatory Visit (HOSPITAL_COMMUNITY): Payer: Medicare Other | Attending: Internal Medicine | Admitting: Radiology

## 2014-09-08 VITALS — BP 171/81 | HR 63 | Ht 73.0 in | Wt 194.0 lb

## 2014-09-08 DIAGNOSIS — R5383 Other fatigue: Secondary | ICD-10-CM

## 2014-09-08 DIAGNOSIS — R079 Chest pain, unspecified: Secondary | ICD-10-CM | POA: Diagnosis not present

## 2014-09-08 DIAGNOSIS — R0600 Dyspnea, unspecified: Secondary | ICD-10-CM | POA: Diagnosis not present

## 2014-09-08 DIAGNOSIS — R0602 Shortness of breath: Secondary | ICD-10-CM

## 2014-09-08 MED ORDER — TECHNETIUM TC 99M SESTAMIBI GENERIC - CARDIOLITE
10.0000 | Freq: Once | INTRAVENOUS | Status: AC | PRN
  Administered 2014-09-08: 10 via INTRAVENOUS

## 2014-09-08 MED ORDER — TECHNETIUM TC 99M SESTAMIBI GENERIC - CARDIOLITE
30.0000 | Freq: Once | INTRAVENOUS | Status: AC | PRN
  Administered 2014-09-08: 30 via INTRAVENOUS

## 2014-09-08 NOTE — Progress Notes (Signed)
[facility] [address] [place], [place] [address] [phone]    Cardiology Nuclear Med Study  Gary Choi is a 73 y.o. male     MRN : [id number]     DOB: June 25, 1941  Procedure Date: 09/08/2014  Nuclear Med Background Indication for Stress Test:  Evaluation for Ischemia History:  MPI ~15 yrs ago (normal) Cardiac Risk Factors: N/A  Symptoms:  Chest Pain (last date of chest discomfort was two days ago), DOE and Fatigue   Nuclear Pre-Procedure Caffeine/Decaff Intake:  None NPO After: 7:00pm   Lungs:  clear O2 Sat: 96% on room air. IV 0.9% NS with Angio Cath:  22g  IV Site: R Hand  IV Started by:  Crissie Figures, RN  Chest Size (in):  44 Cup Size: n/a  Height: 6\' 1"  (1.854 m)  Weight:  194 lb (87.998 kg)  BMI:  Body mass index is 25.6 kg/(m^2). Tech Comments:  N/A    Nuclear Med Study 1 or 2 day study: 1 day  Stress Test Type:  Stress  Reading MD: N/A  Order Authorizing Provider:  Dorris Carnes, MD  Resting Radionuclide: Technetium 45m Sestamibi  Resting Radionuclide Dose: 11.0 mCi   Stress Radionuclide:  Technetium 81m Sestamibi  Stress Radionuclide Dose: 33.0 mCi           Stress Protocol Rest HR: 63 Stress HR: 150  Rest BP: 171/81 Stress BP: 192/120?  Exercise Time (min): 5:00 METS: 7.0           Dose of Adenosine (mg):  n/a Dose of Lexiscan: n/a mg  Dose of Atropine (mg): n/a Dose of Dobutamine: n/a mcg/kg/min (at max HR)  Stress Test Technologist: Glade Lloyd, BS-ES  Nuclear Technologist:  Earl Many, CNMT     Rest Procedure:  Myocardial perfusion imaging was performed at rest 45 minutes following the intravenous administration of Technetium 22m Sestamibi. Rest ECG: NSR-RBBB  Stress Procedure:  The patient exercised on the treadmill utilizing the Bruce Protocol for 5:00 minutes. The patient stopped due to fatigue and denied any chest pain.  Technetium 52m Sestamibi was injected at peak exercise and myocardial perfusion  imaging was performed after a brief delay. Stress ECG:  NO ST changes to suggest ischemia  QPS Raw Data Images:  Soft tissue (diaphragm bowel activity) underlie heart.   Stress Images:  Thinning with decreased tracer activity in the inferolateral wall (base, mid), minimally at apex.   Rest Images: No significant improvement from the stress images.   Subtraction (SDS):  No significant ischemia   Transient Ischemic Dilatation (Normal <1.22):  0.93 Lung/Heart Ratio (Normal <0.45):  0.27  Quantitative Gated Spect Images QGS EDV:  103 ml QGS ESV:  49 ml  Impression Exercise Capacity:  Fair exercise capacity. BP Response:  Hypertensive blood pressure response. Clinical Symptoms: No CP   ECG Impression: No ST changes to suggest ischemia.   Comparison with Prior Nuclear Study: Prior study over 15 years ago, reported normal.     Overall Impression: Small region of mild thinning in inferolateral wall (base,mid) that is rel fixed.  Consistent with probable soft tissue attenuation (diaphragm, bowel acitvity)  Cannot exclude subendocardial scar. No evidence for ischemia.  Overall Low risk scan.   LV Ejection Fraction: 52%.  LV Wall Motion:  Normal wall thickening  Dorris Carnes

## 2014-09-12 ENCOUNTER — Telehealth: Payer: Self-pay | Admitting: *Deleted

## 2014-09-12 MED ORDER — AMLODIPINE BESYLATE 2.5 MG PO TABS: 2.5000 mg | ORAL_TABLET | Freq: Every day | ORAL | Status: DC

## 2014-09-12 NOTE — Telephone Encounter (Signed)
Message copied by Rodman Key on Tue Sep 12, 2014  6:25 PM ------      Message from: Fay Records      Created: Mon Sep 11, 2014  9:17 PM       Stress test results: No evidence for blood flow problem to heart.  Does not explain SOB      His blood pressure did spike during test  This may explain some of symptoms.       I would recomm he start amlodipine   2.5 mg to start.  May need to increase to 5  Would set for follow up in clinic in a few wks to reassess BP and symptoms. ------

## 2014-09-19 DIAGNOSIS — Z08 Encounter for follow-up examination after completed treatment for malignant neoplasm: Secondary | ICD-10-CM | POA: Diagnosis not present

## 2014-09-19 DIAGNOSIS — L570 Actinic keratosis: Secondary | ICD-10-CM | POA: Diagnosis not present

## 2014-09-19 DIAGNOSIS — Z85828 Personal history of other malignant neoplasm of skin: Secondary | ICD-10-CM | POA: Diagnosis not present

## 2014-09-22 DIAGNOSIS — L509 Urticaria, unspecified: Secondary | ICD-10-CM | POA: Diagnosis not present

## 2014-09-22 DIAGNOSIS — F5101 Primary insomnia: Secondary | ICD-10-CM | POA: Diagnosis not present

## 2014-09-22 DIAGNOSIS — Z23 Encounter for immunization: Secondary | ICD-10-CM | POA: Diagnosis not present

## 2014-10-06 ENCOUNTER — Ambulatory Visit (INDEPENDENT_AMBULATORY_CARE_PROVIDER_SITE_OTHER): Payer: Medicare Other | Admitting: Internal Medicine

## 2014-10-06 ENCOUNTER — Encounter: Payer: Self-pay | Admitting: Internal Medicine

## 2014-10-06 VITALS — BP 148/86 | HR 74 | Ht 73.0 in | Wt 195.0 lb

## 2014-10-06 DIAGNOSIS — I10 Essential (primary) hypertension: Secondary | ICD-10-CM

## 2014-10-06 DIAGNOSIS — R0600 Dyspnea, unspecified: Secondary | ICD-10-CM

## 2014-10-06 MED ORDER — AMLODIPINE BESYLATE 5 MG PO TABS: 5.0000 mg | ORAL_TABLET | Freq: Every day | ORAL | Status: DC

## 2014-10-06 NOTE — Progress Notes (Signed)
HPI Patient is a 73 yo with history of fatigue and SOB  I saw him for the first time earlier this fall Sched for a stress myoview  Test signif for marked hypertensive response to exercise No ischemia.  Inferior changes consistent with soft tissue attenuation. He was started on amlodipine 2.5 mg  SInce seen he continues to feel  About the same.  Doesn't have interest in exercising.   Allergies  Allergen Reactions  . Lunesta [Eszopiclone] Other (See Comments)    Not effective    Current Outpatient Prescriptions  Medication Sig Dispense Refill  . amLODipine (NORVASC) 2.5 MG tablet Take 1 tablet (2.5 mg total) by mouth daily.  30 tablet  3  . atorvastatin (LIPITOR) 20 MG tablet Take 20 mg by mouth daily.      . Triprolidine-Pseudoephedrine (ANTIHISTAMINE PO) Take by mouth as directed.      . zolpidem (AMBIEN CR) 12.5 MG CR tablet Take 12.5 mg by mouth at bedtime as needed for sleep.       No current facility-administered medications for this visit.    No past medical history on file.  No past surgical history on file.  No family history on file.  History   Social History  . Marital Status: Single    Spouse Name: N/A    Number of Children: N/A  . Years of Education: N/A   Occupational History  . Not on file.   Social History Main Topics  . Smoking status: Former Research scientist (life sciences)  . Smokeless tobacco: Not on file  . Alcohol Use: Not on file  . Drug Use: Not on file  . Sexual Activity: Not on file   Other Topics Concern  . Not on file   Social History Narrative   Tobacco use cigarettes: Former smoker ,quit in year, 2000 , Pack year Hx 3 - Tobacco history 07/28/2014. No smoking quit 2000 Alcohol : yes , Rare beer No Recreational drug use Exercise active Occupation retired from [employer]. Marital Status  Married  Children 1 son Blane -Chiropractor  relgion : not important - Seat belt use : yes    Review of Systems:  All systems reviewed.  They are negative  to the above problem except as previously stated.  Vital Signs: BP 148/86  Pulse 74  Ht 6\' 1"  (1.854 m)  Wt 195 lb (88.451 kg)  BMI 25.73 kg/m2  SpO2 97%  Physical Exam Patient is in NAD HEENT:  Normocephalic, atraumatic. EOMI, PERRLA.  Neck: JVP is normal.  No bruits.  Lungs: clear to auscultation. No rales no wheezes.  Heart: Regular rate and rhythm. Normal S1, S2. No S3.   No significant murmurs. PMI not displaced.  Abdomen:  Supple, nontender. Normal bowel sounds. No masses. No hepatomegaly.  Extremities:   Good distal pulses throughout. No lower extremity edema.  Musculoskeletal :moving all extremities.  Neuro:   alert and oriented x3.  CN II-XII grossly intact.  Assessment and Plan: 1.  HTN  WIll increase to 5  2.  SOB  Encouraged him to increase activity and follow.

## 2014-10-06 NOTE — Patient Instructions (Signed)
Your physician has recommended you make the following change in your medication 1.) increase amlodipine to 5 mg once daily  Your physician recommends that you schedule a follow-up appointment in: 6 weeks with Dr. Harrington Challenger.

## 2014-10-23 DIAGNOSIS — N4340 Spermatocele of epididymis, unspecified: Secondary | ICD-10-CM | POA: Diagnosis not present

## 2014-10-23 DIAGNOSIS — Z87891 Personal history of nicotine dependence: Secondary | ICD-10-CM | POA: Diagnosis not present

## 2014-10-23 DIAGNOSIS — Z85038 Personal history of other malignant neoplasm of large intestine: Secondary | ICD-10-CM | POA: Diagnosis not present

## 2014-10-23 DIAGNOSIS — N529 Male erectile dysfunction, unspecified: Secondary | ICD-10-CM | POA: Diagnosis not present

## 2014-10-23 DIAGNOSIS — N508 Other specified disorders of male genital organs: Secondary | ICD-10-CM | POA: Diagnosis not present

## 2014-10-23 DIAGNOSIS — C61 Malignant neoplasm of prostate: Secondary | ICD-10-CM | POA: Diagnosis not present

## 2014-10-23 DIAGNOSIS — N528 Other male erectile dysfunction: Secondary | ICD-10-CM | POA: Diagnosis not present

## 2014-10-23 DIAGNOSIS — N320 Bladder-neck obstruction: Secondary | ICD-10-CM | POA: Diagnosis not present

## 2014-10-23 DIAGNOSIS — Z85068 Personal history of other malignant neoplasm of small intestine: Secondary | ICD-10-CM | POA: Diagnosis not present

## 2014-10-23 DIAGNOSIS — Z8546 Personal history of malignant neoplasm of prostate: Secondary | ICD-10-CM | POA: Diagnosis not present

## 2014-10-23 DIAGNOSIS — Z8505 Personal history of malignant neoplasm of liver: Secondary | ICD-10-CM | POA: Diagnosis not present

## 2014-11-07 ENCOUNTER — Encounter: Payer: Self-pay | Admitting: Internal Medicine

## 2014-11-14 ENCOUNTER — Ambulatory Visit (INDEPENDENT_AMBULATORY_CARE_PROVIDER_SITE_OTHER): Payer: Medicare Other | Admitting: Internal Medicine

## 2014-11-14 ENCOUNTER — Encounter: Payer: Self-pay | Admitting: Internal Medicine

## 2014-11-14 VITALS — BP 132/70 | HR 85 | Ht 73.0 in | Wt 199.1 lb

## 2014-11-14 DIAGNOSIS — R0602 Shortness of breath: Secondary | ICD-10-CM

## 2014-11-14 DIAGNOSIS — R5383 Other fatigue: Secondary | ICD-10-CM

## 2014-11-14 LAB — D-DIMER, QUANTITATIVE (NOT AT ARMC): D DIMER QUANT: 0.74 ug{FEU}/mL — AB (ref 0.00–0.48)

## 2014-11-14 NOTE — Patient Instructions (Signed)
**Note De-Identified Estephanie Hubbs Obfuscation** Your physician recommends that you continue on your current medications as directed. Please refer to the Current Medication list given to you today.  Your physician has recommended that you have a cardiopulmonary stress test (CPX). CPX testing is a non-invasive measurement of heart and lung function. It replaces a traditional treadmill stress test. This type of test provides a tremendous amount of information that relates not only to your present condition but also for future outcomes. This test combines measurements of you ventilation, respiratory gas exchange in the lungs, electrocardiogram (EKG), blood pressure and physical response before, during, and following an exercise protocol.  Your physician recommends that you return for lab work in: today (TSH, D dimer, Programmer, applications)

## 2014-11-14 NOTE — Progress Notes (Signed)
HPI Patient is a 73 yo with history of fatigue and SOB  I saw him for the first time earlier this fall Sched for a stress myoview  Test signif for marked hypertensive response to exercise No ischemia.  Inferior changes consistent with soft tissue attenuation. He was started on amlodipine 2.5 mg and increased to 5  SInce he was last seen he still feels about the same  When he tried to hit a round of golf balls for example he just gave out No CP  NO PND  No syncpe  Allergies  Allergen Reactions  . Lunesta [Eszopiclone] Other (See Comments)    Not effective    Current Outpatient Prescriptions  Medication Sig Dispense Refill  . amLODipine (NORVASC) 5 MG tablet Take 1 tablet (5 mg total) by mouth daily. 90 tablet 3  . atorvastatin (LIPITOR) 20 MG tablet Take 20 mg by mouth daily.    . Triprolidine-Pseudoephedrine (ANTIHISTAMINE PO) Take by mouth as directed.    . zolpidem (AMBIEN CR) 12.5 MG CR tablet Take 12.5 mg by mouth at bedtime as needed for sleep.     No current facility-administered medications for this visit.    No past medical history on file.  No past surgical history on file.  No family history on file.  History   Social History  . Marital Status: Single    Spouse Name: N/A    Number of Children: N/A  . Years of Education: N/A   Occupational History  . Not on file.   Social History Main Topics  . Smoking status: Former Research scientist (life sciences)  . Smokeless tobacco: Not on file  . Alcohol Use: Not on file  . Drug Use: Not on file  . Sexual Activity: Not on file   Other Topics Concern  . Not on file   Social History Narrative   Tobacco use cigarettes: Former smoker ,quit in year, 2000 , Pack year Hx 60 - Tobacco history 07/28/2014. No smoking quit 2000 Alcohol : yes , Rare beer No Recreational drug use Exercise active Occupation retired from [employer]. Marital Status  Married  Children 1 son Amal -Chiropractor  relgion : not important - Seat belt use :  yes    Review of Systems:  All systems reviewed.  They are negative to the above problem except as previously stated.  Vital Signs: BP 132/70 mmHg  Pulse 85  Ht 6\' 1"  (5.427 m)  Wt 199 lb 1.9 oz (90.32 kg)  BMI 26.28 kg/m2  Physical Exam Patient is in NAD HEENT:  Normocephalic, atraumatic. EOMI, PERRLA.  Neck: JVP is normal.  No bruits.  Lungs: clear to auscultation. No rales no wheezes.  Heart: Regular rate and rhythm. Normal S1, S2. No S3.   No significant murmurs. PMI not displaced.  Abdomen:  Supple, nontender. Normal bowel sounds. No masses. No hepatomegaly.  Extremities:   Good distal pulses throughout. No lower extremity edema.  Musculoskeletal :moving all extremities.  Neuro:   alert and oriented x3.  CN II-XII grossly intact.  Assessment and Plan: 1.  HTN  BP is good at rest  2.  SOB Still not feeling normal  Gives out  Concerning  I have reviewed myoview images again   Does not appear to show signif ischemia  LVEF 52%   Discussed cardiopulmonary stress test  WIll check on sched. Check CBC, TSH, D Dimer today.

## 2014-11-15 ENCOUNTER — Telehealth: Payer: Self-pay | Admitting: *Deleted

## 2014-11-15 DIAGNOSIS — R0602 Shortness of breath: Secondary | ICD-10-CM

## 2014-11-15 LAB — BASIC METABOLIC PANEL
BUN: 22 mg/dL (ref 6–23)
CHLORIDE: 104 meq/L (ref 96–112)
CO2: 27 meq/L (ref 19–32)
Calcium: 9.3 mg/dL (ref 8.4–10.5)
Creatinine, Ser: 1.1 mg/dL (ref 0.4–1.5)
GFR: 66.91 mL/min (ref >60.00)
GLUCOSE: 128 mg/dL — AB (ref 70–99)
POTASSIUM: 4.2 meq/L (ref 3.5–5.1)
SODIUM: 137 meq/L (ref 135–145)

## 2014-11-15 LAB — CBC WITH DIFFERENTIAL/PLATELET
BASOS ABS: 0 10*3/uL (ref 0.0–0.1)
Basophils Relative: 0.3 % (ref 0.0–3.0)
EOS PCT: 2.9 % (ref 0.0–5.0)
Eosinophils Absolute: 0.2 10*3/uL (ref 0.0–0.7)
HEMATOCRIT: 39.3 % (ref 39.0–52.0)
HEMOGLOBIN: 13.2 g/dL (ref 13.0–17.0)
LYMPHS ABS: 2.6 10*3/uL (ref 0.7–4.0)
LYMPHS PCT: 32.4 % (ref 12.0–46.0)
MCHC: 33.5 g/dL (ref 30.0–36.0)
MCV: 88.9 fl (ref 78.0–100.0)
MONOS PCT: 7.2 % (ref 3.0–12.0)
Monocytes Absolute: 0.6 10*3/uL (ref 0.1–1.0)
Neutro Abs: 4.5 10*3/uL (ref 1.4–7.7)
Neutrophils Relative %: 57.2 % (ref 43.0–77.0)
PLATELETS: 206 10*3/uL (ref 150.0–400.0)
RBC: 4.42 Mil/uL (ref 4.22–5.81)
RDW: 12.9 % (ref 11.5–15.5)
WBC: 7.9 10*3/uL (ref 4.0–10.5)

## 2014-11-15 LAB — TSH: TSH: 1.66 u[IU]/mL (ref 0.35–4.50)

## 2014-11-15 NOTE — Telephone Encounter (Signed)
Called patient Informed lab results are wnl. Dr. Harrington Challenger rec echocardiogram, since unable to have CTX until sometime in January, to further eval his SOB. Pt is agreeable. Explained the procedure and that a scheduler will call to schedule echo. Verbalizes understanding.

## 2014-11-17 ENCOUNTER — Ambulatory Visit: Payer: Medicare Other | Admitting: Internal Medicine

## 2014-11-17 ENCOUNTER — Ambulatory Visit (HOSPITAL_COMMUNITY): Payer: Medicare Other | Attending: Internal Medicine | Admitting: Radiology

## 2014-11-17 DIAGNOSIS — R0600 Dyspnea, unspecified: Secondary | ICD-10-CM

## 2014-11-17 DIAGNOSIS — R5383 Other fatigue: Secondary | ICD-10-CM | POA: Diagnosis not present

## 2014-11-17 DIAGNOSIS — Z87891 Personal history of nicotine dependence: Secondary | ICD-10-CM | POA: Insufficient documentation

## 2014-11-17 DIAGNOSIS — R0602 Shortness of breath: Secondary | ICD-10-CM

## 2014-11-17 NOTE — Progress Notes (Signed)
Echocardiogram performed.  

## 2014-11-23 ENCOUNTER — Telehealth: Payer: Self-pay | Admitting: Internal Medicine

## 2014-11-23 NOTE — Telephone Encounter (Signed)
New Msg     Pt returning call about echo results, please contact at [phone].

## 2014-11-23 NOTE — Telephone Encounter (Signed)
Returned call to patient he stated he was calling Gary Choi back for echo results.Echo results given.Advised Gary Choi will call back to set up cardiopulmonary stress test.

## 2014-11-28 ENCOUNTER — Other Ambulatory Visit: Payer: Self-pay | Admitting: *Deleted

## 2014-11-28 DIAGNOSIS — R0602 Shortness of breath: Secondary | ICD-10-CM

## 2014-12-12 NOTE — Telephone Encounter (Signed)
Notes Recorded by Fay Records, MD on 11/21/2014 at 4:10 PM Tried to call patient No answer Echo shows both R and L side of the heart pump normally There is some mild decrease in how heart relaxes but this very common at age 74 I do not think it explains his SOB CXR from July suggests some degree of emphysema I think it is important to objectify lung function with PFTs (cardiopulmnary stress test could not be done until January sometime so would go With PFTs)  PT IS SCHEDULED FOR PFTs ON 12/21/14.

## 2015-02-05 DIAGNOSIS — M9903 Segmental and somatic dysfunction of lumbar region: Secondary | ICD-10-CM | POA: Diagnosis not present

## 2015-02-05 DIAGNOSIS — M9904 Segmental and somatic dysfunction of sacral region: Secondary | ICD-10-CM | POA: Diagnosis not present

## 2015-02-05 DIAGNOSIS — M5135 Other intervertebral disc degeneration, thoracolumbar region: Secondary | ICD-10-CM | POA: Diagnosis not present

## 2015-02-05 DIAGNOSIS — M5137 Other intervertebral disc degeneration, lumbosacral region: Secondary | ICD-10-CM | POA: Diagnosis not present

## 2015-02-05 DIAGNOSIS — M5136 Other intervertebral disc degeneration, lumbar region: Secondary | ICD-10-CM | POA: Diagnosis not present

## 2015-02-05 DIAGNOSIS — M9902 Segmental and somatic dysfunction of thoracic region: Secondary | ICD-10-CM | POA: Diagnosis not present

## 2015-03-20 DIAGNOSIS — Z08 Encounter for follow-up examination after completed treatment for malignant neoplasm: Secondary | ICD-10-CM | POA: Diagnosis not present

## 2015-03-20 DIAGNOSIS — L570 Actinic keratosis: Secondary | ICD-10-CM | POA: Diagnosis not present

## 2015-03-20 DIAGNOSIS — Z85828 Personal history of other malignant neoplasm of skin: Secondary | ICD-10-CM | POA: Diagnosis not present

## 2015-05-01 DIAGNOSIS — R252 Cramp and spasm: Secondary | ICD-10-CM | POA: Diagnosis not present

## 2015-05-01 DIAGNOSIS — E785 Hyperlipidemia, unspecified: Secondary | ICD-10-CM | POA: Diagnosis not present

## 2015-05-01 DIAGNOSIS — F5101 Primary insomnia: Secondary | ICD-10-CM | POA: Diagnosis not present

## 2015-05-01 DIAGNOSIS — R030 Elevated blood-pressure reading, without diagnosis of hypertension: Secondary | ICD-10-CM | POA: Diagnosis not present

## 2015-06-08 DIAGNOSIS — M5136 Other intervertebral disc degeneration, lumbar region: Secondary | ICD-10-CM | POA: Diagnosis not present

## 2015-06-08 DIAGNOSIS — M5135 Other intervertebral disc degeneration, thoracolumbar region: Secondary | ICD-10-CM | POA: Diagnosis not present

## 2015-06-08 DIAGNOSIS — M9904 Segmental and somatic dysfunction of sacral region: Secondary | ICD-10-CM | POA: Diagnosis not present

## 2015-06-08 DIAGNOSIS — M5137 Other intervertebral disc degeneration, lumbosacral region: Secondary | ICD-10-CM | POA: Diagnosis not present

## 2015-06-08 DIAGNOSIS — M9902 Segmental and somatic dysfunction of thoracic region: Secondary | ICD-10-CM | POA: Diagnosis not present

## 2015-06-08 DIAGNOSIS — M9903 Segmental and somatic dysfunction of lumbar region: Secondary | ICD-10-CM | POA: Diagnosis not present

## 2015-07-02 DIAGNOSIS — M5135 Other intervertebral disc degeneration, thoracolumbar region: Secondary | ICD-10-CM | POA: Diagnosis not present

## 2015-07-02 DIAGNOSIS — M9902 Segmental and somatic dysfunction of thoracic region: Secondary | ICD-10-CM | POA: Diagnosis not present

## 2015-07-02 DIAGNOSIS — M9904 Segmental and somatic dysfunction of sacral region: Secondary | ICD-10-CM | POA: Diagnosis not present

## 2015-07-02 DIAGNOSIS — M9903 Segmental and somatic dysfunction of lumbar region: Secondary | ICD-10-CM | POA: Diagnosis not present

## 2015-07-02 DIAGNOSIS — M5136 Other intervertebral disc degeneration, lumbar region: Secondary | ICD-10-CM | POA: Diagnosis not present

## 2015-07-02 DIAGNOSIS — M5137 Other intervertebral disc degeneration, lumbosacral region: Secondary | ICD-10-CM | POA: Diagnosis not present

## 2015-09-25 DIAGNOSIS — Z08 Encounter for follow-up examination after completed treatment for malignant neoplasm: Secondary | ICD-10-CM | POA: Diagnosis not present

## 2015-09-25 DIAGNOSIS — Z85828 Personal history of other malignant neoplasm of skin: Secondary | ICD-10-CM | POA: Diagnosis not present

## 2015-09-25 DIAGNOSIS — L820 Inflamed seborrheic keratosis: Secondary | ICD-10-CM | POA: Diagnosis not present

## 2015-09-25 DIAGNOSIS — L570 Actinic keratosis: Secondary | ICD-10-CM | POA: Diagnosis not present

## 2015-09-25 DIAGNOSIS — C44212 Basal cell carcinoma of skin of right ear and external auricular canal: Secondary | ICD-10-CM | POA: Diagnosis not present

## 2015-10-12 DIAGNOSIS — C61 Malignant neoplasm of prostate: Secondary | ICD-10-CM | POA: Diagnosis not present

## 2015-10-12 DIAGNOSIS — N529 Male erectile dysfunction, unspecified: Secondary | ICD-10-CM | POA: Diagnosis not present

## 2015-10-12 DIAGNOSIS — N4340 Spermatocele of epididymis, unspecified: Secondary | ICD-10-CM | POA: Diagnosis not present

## 2015-10-12 DIAGNOSIS — N320 Bladder-neck obstruction: Secondary | ICD-10-CM | POA: Diagnosis not present

## 2015-10-12 DIAGNOSIS — N503 Cyst of epididymis: Secondary | ICD-10-CM | POA: Diagnosis not present

## 2015-11-06 DIAGNOSIS — H2512 Age-related nuclear cataract, left eye: Secondary | ICD-10-CM | POA: Diagnosis not present

## 2015-11-06 DIAGNOSIS — H2511 Age-related nuclear cataract, right eye: Secondary | ICD-10-CM | POA: Diagnosis not present

## 2015-11-07 DIAGNOSIS — R252 Cramp and spasm: Secondary | ICD-10-CM | POA: Diagnosis not present

## 2015-11-07 DIAGNOSIS — G4700 Insomnia, unspecified: Secondary | ICD-10-CM | POA: Diagnosis not present

## 2015-11-07 DIAGNOSIS — Z23 Encounter for immunization: Secondary | ICD-10-CM | POA: Diagnosis not present

## 2015-11-14 DIAGNOSIS — H269 Unspecified cataract: Secondary | ICD-10-CM | POA: Diagnosis not present

## 2015-11-14 DIAGNOSIS — H268 Other specified cataract: Secondary | ICD-10-CM | POA: Diagnosis not present

## 2015-11-14 DIAGNOSIS — H2512 Age-related nuclear cataract, left eye: Secondary | ICD-10-CM | POA: Diagnosis not present

## 2015-12-04 DIAGNOSIS — H2511 Age-related nuclear cataract, right eye: Secondary | ICD-10-CM | POA: Diagnosis not present

## 2015-12-12 DIAGNOSIS — H25811 Combined forms of age-related cataract, right eye: Secondary | ICD-10-CM | POA: Diagnosis not present

## 2015-12-12 DIAGNOSIS — H2511 Age-related nuclear cataract, right eye: Secondary | ICD-10-CM | POA: Diagnosis not present

## 2016-03-05 ENCOUNTER — Ambulatory Visit
Admission: RE | Admit: 2016-03-05 | Discharge: 2016-03-05 | Disposition: A | Discharge status: Home / Self Care | Payer: Medicare Other | Source: Ambulatory Visit | Attending: Family Medicine | Admitting: Family Medicine

## 2016-03-05 ENCOUNTER — Other Ambulatory Visit: Payer: Self-pay | Admitting: Family Medicine

## 2016-03-05 DIAGNOSIS — M25551 Pain in right hip: Secondary | ICD-10-CM

## 2016-03-05 DIAGNOSIS — M1611 Unilateral primary osteoarthritis, right hip: Secondary | ICD-10-CM | POA: Diagnosis not present

## 2016-03-11 DIAGNOSIS — M25551 Pain in right hip: Secondary | ICD-10-CM | POA: Diagnosis not present

## 2016-03-13 DIAGNOSIS — M25551 Pain in right hip: Secondary | ICD-10-CM | POA: Diagnosis not present

## 2016-03-18 DIAGNOSIS — Z08 Encounter for follow-up examination after completed treatment for malignant neoplasm: Secondary | ICD-10-CM | POA: Diagnosis not present

## 2016-03-18 DIAGNOSIS — L570 Actinic keratosis: Secondary | ICD-10-CM | POA: Diagnosis not present

## 2016-03-18 DIAGNOSIS — Z85828 Personal history of other malignant neoplasm of skin: Secondary | ICD-10-CM | POA: Diagnosis not present

## 2016-03-25 DIAGNOSIS — M7061 Trochanteric bursitis, right hip: Secondary | ICD-10-CM | POA: Diagnosis not present

## 2016-03-26 DIAGNOSIS — M25551 Pain in right hip: Secondary | ICD-10-CM | POA: Diagnosis not present

## 2016-03-28 DIAGNOSIS — M25551 Pain in right hip: Secondary | ICD-10-CM | POA: Diagnosis not present

## 2016-04-01 DIAGNOSIS — M25551 Pain in right hip: Secondary | ICD-10-CM | POA: Diagnosis not present

## 2016-04-04 DIAGNOSIS — M25551 Pain in right hip: Secondary | ICD-10-CM | POA: Diagnosis not present

## 2016-04-08 DIAGNOSIS — M25551 Pain in right hip: Secondary | ICD-10-CM | POA: Diagnosis not present

## 2016-04-11 DIAGNOSIS — C61 Malignant neoplasm of prostate: Secondary | ICD-10-CM | POA: Diagnosis not present

## 2016-04-11 DIAGNOSIS — N320 Bladder-neck obstruction: Secondary | ICD-10-CM | POA: Diagnosis not present

## 2016-04-11 DIAGNOSIS — N4340 Spermatocele of epididymis, unspecified: Secondary | ICD-10-CM | POA: Diagnosis not present

## 2016-04-11 DIAGNOSIS — Z8546 Personal history of malignant neoplasm of prostate: Secondary | ICD-10-CM | POA: Diagnosis not present

## 2016-04-11 DIAGNOSIS — N528 Other male erectile dysfunction: Secondary | ICD-10-CM | POA: Diagnosis not present

## 2016-04-11 DIAGNOSIS — M25551 Pain in right hip: Secondary | ICD-10-CM | POA: Diagnosis not present

## 2016-04-15 DIAGNOSIS — M25551 Pain in right hip: Secondary | ICD-10-CM | POA: Diagnosis not present

## 2016-04-25 DIAGNOSIS — Z8546 Personal history of malignant neoplasm of prostate: Secondary | ICD-10-CM | POA: Diagnosis not present

## 2016-04-25 DIAGNOSIS — N529 Male erectile dysfunction, unspecified: Secondary | ICD-10-CM | POA: Diagnosis not present

## 2016-05-01 DIAGNOSIS — M1611 Unilateral primary osteoarthritis, right hip: Secondary | ICD-10-CM | POA: Diagnosis not present

## 2016-05-06 DIAGNOSIS — Z23 Encounter for immunization: Secondary | ICD-10-CM | POA: Diagnosis not present

## 2016-05-06 DIAGNOSIS — Z0000 Encounter for general adult medical examination without abnormal findings: Secondary | ICD-10-CM | POA: Diagnosis not present

## 2016-05-06 DIAGNOSIS — E785 Hyperlipidemia, unspecified: Secondary | ICD-10-CM | POA: Diagnosis not present

## 2016-05-06 DIAGNOSIS — G4700 Insomnia, unspecified: Secondary | ICD-10-CM | POA: Diagnosis not present

## 2016-05-06 DIAGNOSIS — F17201 Nicotine dependence, unspecified, in remission: Secondary | ICD-10-CM | POA: Diagnosis not present

## 2016-05-14 DIAGNOSIS — M1611 Unilateral primary osteoarthritis, right hip: Secondary | ICD-10-CM | POA: Diagnosis not present

## 2016-05-16 ENCOUNTER — Telehealth: Payer: Self-pay | Admitting: Acute Care

## 2016-05-16 DIAGNOSIS — Z87891 Personal history of nicotine dependence: Secondary | ICD-10-CM

## 2016-05-22 DIAGNOSIS — M1611 Unilateral primary osteoarthritis, right hip: Secondary | ICD-10-CM | POA: Diagnosis not present

## 2016-05-23 ENCOUNTER — Other Ambulatory Visit: Payer: Self-pay | Admitting: Family Medicine

## 2016-05-23 DIAGNOSIS — R0989 Other specified symptoms and signs involving the circulatory and respiratory systems: Secondary | ICD-10-CM

## 2016-05-23 DIAGNOSIS — M79604 Pain in right leg: Secondary | ICD-10-CM | POA: Diagnosis not present

## 2016-05-23 NOTE — Telephone Encounter (Signed)
Patient calling to set up lung cancer screening, CB is [phone].

## 2016-05-29 ENCOUNTER — Ambulatory Visit
Admission: RE | Admit: 2016-05-29 | Discharge: 2016-05-29 | Disposition: A | Discharge status: Home / Self Care | Payer: Medicare Other | Source: Ambulatory Visit | Attending: Family Medicine | Admitting: Family Medicine

## 2016-05-29 DIAGNOSIS — I743 Embolism and thrombosis of arteries of the lower extremities: Secondary | ICD-10-CM | POA: Diagnosis not present

## 2016-05-29 DIAGNOSIS — R0989 Other specified symptoms and signs involving the circulatory and respiratory systems: Secondary | ICD-10-CM

## 2016-05-30 ENCOUNTER — Other Ambulatory Visit: Payer: Self-pay | Admitting: Family Medicine

## 2016-05-30 DIAGNOSIS — I739 Peripheral vascular disease, unspecified: Secondary | ICD-10-CM

## 2016-05-30 NOTE — Telephone Encounter (Signed)
Dr Caren Griffins [facility] office is calling back about scheduling this pt [phone]

## 2016-05-30 NOTE — Telephone Encounter (Signed)
Gary Choi please advise. Thanks.

## 2016-06-04 ENCOUNTER — Ambulatory Visit
Admission: RE | Admit: 2016-06-04 | Discharge: 2016-06-04 | Disposition: A | Discharge status: Home / Self Care | Payer: Medicare Other | Source: Ambulatory Visit | Attending: Family Medicine | Admitting: Family Medicine

## 2016-06-04 DIAGNOSIS — I745 Embolism and thrombosis of iliac artery: Secondary | ICD-10-CM | POA: Diagnosis not present

## 2016-06-04 DIAGNOSIS — I739 Peripheral vascular disease, unspecified: Secondary | ICD-10-CM

## 2016-06-04 MED ORDER — IOPAMIDOL (ISOVUE-370) INJECTION 76%
125.0000 mL | Freq: Once | INTRAVENOUS | Status: AC | PRN
  Administered 2016-06-04: 125 mL via INTRAVENOUS

## 2016-06-04 NOTE — Telephone Encounter (Signed)
Called spoke with pt. Discussed lung cancer screening program and he does qualify. Scheduled [facility] 06/25/16. Ct order placed. Nothing further needed.

## 2016-06-06 ENCOUNTER — Encounter: Payer: Self-pay | Admitting: Surgery

## 2016-06-06 ENCOUNTER — Other Ambulatory Visit: Payer: Self-pay

## 2016-06-06 ENCOUNTER — Ambulatory Visit (INDEPENDENT_AMBULATORY_CARE_PROVIDER_SITE_OTHER): Payer: Medicare Other | Admitting: Surgery

## 2016-06-06 VITALS — BP 160/85 | HR 73 | Temp 97.2°F | Resp 20 | Ht 72.25 in | Wt 188.9 lb

## 2016-06-06 DIAGNOSIS — I745 Embolism and thrombosis of iliac artery: Secondary | ICD-10-CM | POA: Insufficient documentation

## 2016-06-06 NOTE — Progress Notes (Signed)
[facility] of [place]  Patient name: Gary Choi MRN: [id number] DOB: 1940/12/30 Sex: male  REFERRING PHYSICIAN: Dr. Dema Severin  REASON FOR CONSULT: Right leg pain  HPI: Gary Choi is a 75 y.o. 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  The patient has been struggling with right hip pain as well as pain in his right leg and lateral thigh.  It is exacerbated with walking.  It improves shortly after walking.  He does have chronic back pain.  He recently underwent a CT scan which showed a right iliac occlusion.  He denies rest pain or nonhealing wounds.  The patient suffers from hypercholesterolemia which is treated with a statin.  His most recent LDL was 109.  He has a history of prostate cancer treated with radiation seeds.  He is also undergone partial colectomy and chemotherapy for colon cancer.  He is a nonsmoker.  Past Medical History  Diagnosis Date  . Cancer [facility])     prostate  . Colon cancer ([place])   . Hyperlipidemia     History reviewed. No pertinent family history.  SOCIAL HISTORY: Social History   Social History  . Marital Status: Single    Spouse Name: N/A  . Number of Children: N/A  . Years of Education: N/A   Occupational History  . Not on file.   Social History Main Topics  . Smoking status: Former Smoker -- 1.00 packs/day for 45 years    Types: Cigarettes    Quit date: 06/07/2011  . Smokeless tobacco: Not on file  . Alcohol Use: 0.0 oz/week    0 Standard drinks or equivalent per week  . Drug Use: Not on file  . Sexual Activity: Not on file   Other Topics Concern  . Not on file   Social History Narrative   Tobacco use cigarettes: Former smoker ,quit in year, 2000 , Pack year Hx 72 - Tobacco history 07/28/2014. No smoking quit 2000 Alcohol : yes , Rare beer No Recreational drug use Exercise active Occupation retired from [employer]. Marital Status  Married  Children 1 son  [place] -Chiropractor  relgion : not important - Seat belt use : yes    Allergies  Allergen Reactions  . Lunesta [Eszopiclone] Other (See Comments)    Not effective    Current Outpatient Prescriptions  Medication Sig Dispense Refill  . atorvastatin (LIPITOR) 20 MG tablet Take 20 mg by mouth daily.    Marland Kitchen zolpidem (AMBIEN CR) 12.5 MG CR tablet Take 12.5 mg by mouth at bedtime as needed for sleep.    Marland Kitchen amLODipine (NORVASC) 5 MG tablet Take 1 tablet (5 mg total) by mouth daily. (Patient not taking: Reported on 06/06/2016) 90 tablet 3  . Triprolidine-Pseudoephedrine (ANTIHISTAMINE PO) Take by mouth as directed. Reported on 06/06/2016     No current facility-administered medications for this visit.    REVIEW OF SYSTEMS:  [X]  denotes positive finding, [ ]  denotes negative finding Cardiac  Comments:  Chest pain or chest pressure:    Shortness of breath upon exertion:    Short of breath when lying flat:    Irregular heart rhythm:        Vascular    Pain in calf, thigh, or hip brought on by ambulation: x   Pain in feet at night that wakes you up from your sleep:     Blood clot in your veins:    Leg swelling:  Pulmonary    Oxygen at home:    Productive cough:     Wheezing:         Neurologic    Sudden weakness in arms or legs:     Sudden numbness in arms or legs:     Sudden onset of difficulty speaking or slurred speech:    Temporary loss of vision in one eye:     Problems with dizziness:         Gastrointestinal    Blood in stool:     Vomited blood:         Genitourinary    Burning when urinating:     Blood in urine:        Psychiatric    Major depression:         Hematologic    Bleeding problems:    Problems with blood clotting too easily:        Skin    Rashes or ulcers:        Constitutional    Fever or chills:      PHYSICAL EXAM: Filed Vitals:   06/06/16 1234 06/06/16 1238  BP: 152/86 160/85  Pulse: 73   Temp: 97.2 F (36.2 C)   TempSrc:  Oral   Resp: 20   Height: 6' 0.25" (1.835 m)   Weight: 188 lb 14.4 oz (85.684 kg)     GENERAL: The patient is a well-nourished male, in no acute distress. The vital signs are documented above. CARDIAC: There is a regular rate and rhythm.  VASCULAR: Palpable left pedal pulse, nonpalpable right.  No carotid bruits PULMONARY: There is good air exchange bilaterally without wheezing or rales. ABDOMEN: Soft and non-tender with normal pitched bowel sounds.  MUSCULOSKELETAL: There are no major deformities or cyanosis. NEUROLOGIC: No focal weakness or paresthesias are detected. SKIN: There are no ulcers or rashes noted. PSYCHIATRIC: The patient has a normal affect.  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  No significant outflow or runoff disease was identified  MEDICAL ISSUES: Right leg claudication: I suspect the patient's right hip pain and leg pain are secondary to his right iliac occlusion.  We discussed proceeding with an attempt at percutaneous revascularization.  This is been scheduled for Tuesday, July 18.  I will plan on initial access in the left groin for diagnostic imaging and then an attempt at recannulization through a cannulation of the right groin.  We discussed the risks of the procedure including the risk of dissection, and embolization as well as bleeding, and addition to the possibility that the lesion may not be able to be crossed.  All his questions were answered.  I am starting him on a baby aspirin today.  I will try to move his procedure op if there are cancellations.   Annamarie Major, MD [facility] [phone] Pager [phone]

## 2016-06-06 NOTE — Progress Notes (Signed)
Filed Vitals:   06/06/16 1234 06/06/16 1238  BP: 152/86 160/85  Pulse: 73   Temp: 97.2 F (36.2 C)   TempSrc: Oral   Resp: 20   Height: 6' 0.25" (1.835 m)   Weight: 188 lb 14.4 oz (85.684 kg)

## 2016-06-09 ENCOUNTER — Other Ambulatory Visit: Payer: Medicare Other

## 2016-06-19 ENCOUNTER — Encounter (HOSPITAL_COMMUNITY)
Admission: RE | Disposition: A | Discharge status: Home / Self Care | Payer: Self-pay | Source: Ambulatory Visit | Attending: Surgery

## 2016-06-19 ENCOUNTER — Ambulatory Visit (HOSPITAL_COMMUNITY)
Admission: RE | Admit: 2016-06-19 | Discharge: 2016-06-19 | Disposition: A | Discharge status: Home / Self Care | Payer: Medicare Other | Source: Ambulatory Visit | Attending: Surgery | Admitting: Surgery

## 2016-06-19 DIAGNOSIS — Z923 Personal history of irradiation: Secondary | ICD-10-CM | POA: Insufficient documentation

## 2016-06-19 DIAGNOSIS — Z87891 Personal history of nicotine dependence: Secondary | ICD-10-CM | POA: Insufficient documentation

## 2016-06-19 DIAGNOSIS — I708 Atherosclerosis of other arteries: Secondary | ICD-10-CM | POA: Diagnosis not present

## 2016-06-19 DIAGNOSIS — I745 Embolism and thrombosis of iliac artery: Secondary | ICD-10-CM | POA: Insufficient documentation

## 2016-06-19 DIAGNOSIS — Z8546 Personal history of malignant neoplasm of prostate: Secondary | ICD-10-CM | POA: Insufficient documentation

## 2016-06-19 DIAGNOSIS — E7800 Pure hypercholesterolemia, unspecified: Secondary | ICD-10-CM | POA: Insufficient documentation

## 2016-06-19 DIAGNOSIS — I739 Peripheral vascular disease, unspecified: Secondary | ICD-10-CM | POA: Diagnosis not present

## 2016-06-19 DIAGNOSIS — Z9221 Personal history of antineoplastic chemotherapy: Secondary | ICD-10-CM | POA: Insufficient documentation

## 2016-06-19 DIAGNOSIS — Z85038 Personal history of other malignant neoplasm of large intestine: Secondary | ICD-10-CM | POA: Diagnosis not present

## 2016-06-19 DIAGNOSIS — Z79899 Other long term (current) drug therapy: Secondary | ICD-10-CM | POA: Insufficient documentation

## 2016-06-19 DIAGNOSIS — I70211 Atherosclerosis of native arteries of extremities with intermittent claudication, right leg: Secondary | ICD-10-CM | POA: Diagnosis not present

## 2016-06-19 DIAGNOSIS — Z9049 Acquired absence of other specified parts of digestive tract: Secondary | ICD-10-CM | POA: Insufficient documentation

## 2016-06-19 HISTORY — PX: PERIPHERAL VASCULAR CATHETERIZATION: SHX172C

## 2016-06-19 LAB — POCT ACTIVATED CLOTTING TIME
Activated Clotting Time: 208 seconds
Activated Clotting Time: 246 seconds
Activated Clotting Time: 180 seconds

## 2016-06-19 LAB — POCT I-STAT, CHEM 8
BUN: 22 mg/dL — ABNORMAL HIGH (ref 6–20)
CHLORIDE: 105 mmol/L (ref 101–111)
CREATININE: 1 mg/dL (ref 0.61–1.24)
Calcium, Ion: 1.27 mmol/L — ABNORMAL HIGH (ref 1.12–1.23)
GLUCOSE: 98 mg/dL (ref 65–99)
HCT: 41 % (ref 39.0–52.0)
Hemoglobin: 13.9 g/dL (ref 13.0–17.0)
POTASSIUM: 4.1 mmol/L (ref 3.5–5.1)
Sodium: 143 mmol/L (ref 135–145)
TCO2: 26 mmol/L (ref 0–100)

## 2016-06-19 SURGERY — PERIPHERAL VASCULAR INTERVENTION
Laterality: Right

## 2016-06-19 MED ORDER — IODIXANOL 320 MG/ML IV SOLN
INTRAVENOUS | Status: DC | PRN
  Administered 2016-06-19: 130 mL via INTRA_ARTERIAL

## 2016-06-19 MED ORDER — OXYCODONE-ACETAMINOPHEN 5-325 MG PO TABS: 1.0000 | ORAL_TABLET | Freq: Four times a day (QID) | ORAL | Status: DC | PRN

## 2016-06-19 MED ORDER — LIDOCAINE HCL (PF) 1 % IJ SOLN
INTRAMUSCULAR | Status: AC
  Filled 2016-06-19: qty 30

## 2016-06-19 MED ORDER — CLOPIDOGREL BISULFATE 75 MG PO TABS: 75.0000 mg | ORAL_TABLET | Freq: Every day | ORAL | Status: DC

## 2016-06-19 MED ORDER — FENTANYL CITRATE (PF) 100 MCG/2ML IJ SOLN
INTRAMUSCULAR | Status: AC
  Filled 2016-06-19: qty 2

## 2016-06-19 MED ORDER — HEPARIN (PORCINE) IN NACL 2-0.9 UNIT/ML-% IJ SOLN
INTRAMUSCULAR | Status: DC | PRN
  Administered 2016-06-19: 1000 mL

## 2016-06-19 MED ORDER — SODIUM CHLORIDE 0.9 % IV SOLN
INTRAVENOUS | Status: DC
  Administered 2016-06-19: 09:00:00 via INTRAVENOUS

## 2016-06-19 MED ORDER — CLOPIDOGREL BISULFATE 75 MG PO TABS
300.0000 mg | ORAL_TABLET | Freq: Once | ORAL | Status: AC
  Administered 2016-06-19: 300 mg via ORAL

## 2016-06-19 MED ORDER — CLOPIDOGREL BISULFATE 300 MG PO TABS
ORAL_TABLET | ORAL | Status: AC
  Filled 2016-06-19: qty 1

## 2016-06-19 MED ORDER — MORPHINE SULFATE (PF) 2 MG/ML IV SOLN
INTRAVENOUS | Status: AC
  Filled 2016-06-19: qty 1

## 2016-06-19 MED ORDER — SODIUM CHLORIDE 0.9 % IV SOLN: 1.0000 mL/kg/h | INTRAVENOUS | Status: DC

## 2016-06-19 MED ORDER — LIDOCAINE HCL (PF) 1 % IJ SOLN
INTRAMUSCULAR | Status: DC | PRN
  Administered 2016-06-19: 15 mL via SUBCUTANEOUS
  Administered 2016-06-19: 20 mL via SUBCUTANEOUS

## 2016-06-19 MED ORDER — HEPARIN SODIUM (PORCINE) 1000 UNIT/ML IJ SOLN
INTRAMUSCULAR | Status: DC | PRN
  Administered 2016-06-19: 9000 [IU] via INTRAVENOUS

## 2016-06-19 MED ORDER — HEPARIN SODIUM (PORCINE) 1000 UNIT/ML IJ SOLN
INTRAMUSCULAR | Status: AC
  Filled 2016-06-19: qty 1

## 2016-06-19 MED ORDER — MIDAZOLAM HCL 2 MG/2ML IJ SOLN
INTRAMUSCULAR | Status: DC | PRN
  Administered 2016-06-19: 1 mg via INTRAVENOUS

## 2016-06-19 MED ORDER — MIDAZOLAM HCL 2 MG/2ML IJ SOLN
INTRAMUSCULAR | Status: AC
  Filled 2016-06-19: qty 2

## 2016-06-19 MED ORDER — FENTANYL CITRATE (PF) 100 MCG/2ML IJ SOLN
INTRAMUSCULAR | Status: DC | PRN
  Administered 2016-06-19 (×2): 50 ug via INTRAVENOUS

## 2016-06-19 MED ORDER — ACETAMINOPHEN 325 MG PO TABS: 650.0000 mg | ORAL_TABLET | ORAL | Status: DC | PRN

## 2016-06-19 MED ORDER — HYDRALAZINE HCL 20 MG/ML IJ SOLN: 10.0000 mg | INTRAMUSCULAR | Status: DC | PRN

## 2016-06-19 MED ORDER — HEPARIN (PORCINE) IN NACL 2-0.9 UNIT/ML-% IJ SOLN
INTRAMUSCULAR | Status: AC
  Filled 2016-06-19: qty 1000

## 2016-06-19 MED ORDER — MORPHINE SULFATE (PF) 2 MG/ML IV SOLN
2.0000 mg | INTRAVENOUS | Status: DC | PRN
  Administered 2016-06-19: 2 mg via INTRAVENOUS

## 2016-06-19 SURGICAL SUPPLY — 18 items
CATH OMNI FLUSH 5F 65CM (CATHETERS) ×5 IMPLANT
CATH STRAIGHT 5FR 65CM (CATHETERS) ×5 IMPLANT
DEVICE CONTINUOUS FLUSH (MISCELLANEOUS) ×5 IMPLANT
DEVICE TORQUE .025-.038 (MISCELLANEOUS) ×5 IMPLANT
GUIDEWIRE ANGLED .035X150CM (WIRE) ×5 IMPLANT
KIT ENCORE 26 ADVANTAGE (KITS) ×5 IMPLANT
KIT MICROINTRODUCER STIFF 5F (SHEATH) ×5 IMPLANT
KIT PV (KITS) ×5 IMPLANT
SHEATH PINNACLE 5F 10CM (SHEATH) ×10 IMPLANT
SHEATH PINNACLE ST 7F 45CM (SHEATH) ×5 IMPLANT
STENT LIFESTREAM 8X37X80 (Permanent Stent) ×5 IMPLANT
SYR MEDRAD MARK V 150ML (SYRINGE) ×5 IMPLANT
TAPE RADIOPAQUE TURBO (MISCELLANEOUS) ×5 IMPLANT
TRANSDUCER W/STOPCOCK (MISCELLANEOUS) ×5 IMPLANT
TRAY PV CATH (CUSTOM PROCEDURE TRAY) ×5 IMPLANT
WIRE BENTSON .035X145CM (WIRE) ×5 IMPLANT
WIRE ROSEN-J .035X180CM (WIRE) ×5 IMPLANT
WIRE TORQFLEX AUST .018X40CM (WIRE) ×5 IMPLANT

## 2016-06-19 NOTE — Op Note (Signed)
OPERATIVE NOTE   PROCEDURE: 1.  Bilateral common femoral artery cannulation under ultrasound guidance 2.  Placement of catheter in aorta 3.  Aortogram 4.  Conscious sedation for 50 minutes 5.  Subintimal angioplasty and stenting right common iliac artery (Lifestream 8.0 mm x 35 mm) 6.  Bilateral leg runoff via catheter  PRE-OPERATIVE DIAGNOSIS: right leg intermittent claudication   POST-OPERATIVE DIAGNOSIS: same as above   SURGEON: Adele Barthel, MD  ANESTHESIA: conscious sedation (1 mg Versed, 100 mcg Fentanyl)  ESTIMATED BLOOD LOSS: 50 cc  CONTRAST: 130 cc  FINDING(S):  Aorta: patent   Superior mesenteric artery: not well visualized Celiac artery: patent   Right Left  RA patent patent  CIA Proximal occlusion, resolved with stenting and angioplasty patent  EIA patent patent  IIA patent patent  CFA patent patent  SFA patent patent  PFA patent patent  Pop patent patent  Trif patent patent  AT patent patent  Pero patent patent  PT patent patent   SPECIMEN(S):  none  INDICATIONS:   Gary Choi is a 75 y.o. male who presents with right leg claudication.  The patient presents for: aortogram, bilateral runoff, and possible right leg intervention.  I discussed with the patient the nature of angiographic procedures, especially the limited patencies of any endovascular intervention.  The patient is aware of that the risks of an angiographic procedure include but are not limited to: bleeding, infection, access site complications, renal failure, embolization, rupture of vessel, dissection, possible need for emergent surgical intervention, possible need for surgical procedures to treat the patient's pathology, and stroke and death.  The patient is aware of the risks and agrees to proceed.  DESCRIPTION: After full informed consent was obtained from the patient, the patient was brought back to the angiography suite.  The patient was placed supine upon the angiography table and  connected to cardiopulmonary monitoring equipment.  The patient was then given conscious sedation, the amounts of which are documented in the patient's chart.  A circulating radiologic technician maintained continuous monitoring of the patient's cardiopulmonary status.  Additionally, the control room radiologic technician provided backup monitoring throughout the procedure.  The patient was prepped and drape in the standard fashion for an angiographic procedure.  At this point, attention was turned to the left groin.  Under ultrasound guidance,  The subcutaneous tissue surrounding the left common femoral artery was anesthesized with 1% lidocaine with epinephrine.  The artery was then cannulated with a 18 gauge needle.  The Bentson wire was passed up into the aorta.  The needle was exchanged for a 5-Fr sheath, which was advanced over the wire into the common femoral artery.  The dilator was then removed.  The Omniflush catheter was then loaded over the wire up to the level of L1.  The catheter was connected to the power injector circuit.  After de-airring and de-clotting the circuit, a power injector aortogram was completed.  The findings were listed above.  Based on the images, attempt at right common iliac artery occlusion was indicated.  Under ultrasound guidance, the subcutaneous tissue surrounding the right common femoral artery was anesthesized with 1% lidocaine with epinephrine.  The artery was then cannulated with a micropuncture needle.  The microwire was advanced into the iliac arterial system.  The needle was exchanged for a microsheath, which was loaded into the common femoral artery over the wire.  The microwire was exchanged for a Bentson wire which was advanced into the aorta.  The microsheath was then  exchanged for a 5-Fr sheath which was loaded into the common femoral artery.  I loaded straight catheter over wire and exchanged the wire for a glidewire.  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  I was able to re-enter into the distal aorta.  I did a hand injection to verify re-entry.  I exchanged the wire for a Rosen wire.    The right sheath was exchanged for 7-Fr Destination sheath which was advanced into the distal aorta.  The patient was given 9000 units of Heparin to obtain full anticoagulation.  I placed a Bentson wire in the left sheath and reloaded the Omniflush catheter in the distal aorta.  The wire was removed and then power injector aortogram was completed which demonstrated the orifice of the right common iliac artery and the re-constitution of the distal segment of the right common iliac artery.  Based on measurements, 8 mm x 35 mm balloon expandable covered stent (Lifestream was selected).  I deployed this stent, starting the stent at the orifice of the right common iliac artery, at 6 atm for 1 minute.  On completion injection, there appeared to be some residual waisting proximally.  I replaced the balloon, centered on the waist, and inflated again to 6 atm for 1 minute.  On completion injection, the chronic total occlusion was resolved.   A Omniflush catheter was loaded over the wire and placed into the distal aorta.  The wire was removed and then the catheter connected to the power injector circuit.  An automated bilateral leg runoff was completed.  The findings are listed above.  Based upon the findings, no further intervention was needed.  I replaced the wire into the catheter, straightening out the crook in the catheter.  Both were removed from the sheath together.  The sheath was aspirated.  No clots were present and the sheath was reloaded with heparinized saline.     COMPLICATIONS: none  CONDITION: stable   Adele Barthel, MD [facility] [place] Office: [phone] Pager: [phone]  06/19/2016, 11:19 AM

## 2016-06-19 NOTE — Interval H&P Note (Signed)
Vascular [facility] Vein Specialists of [place]  History and Physical Update  The patient was interviewed and re-examined.  The patient's previous History and Physical has been reviewed and is unchanged from Dr. Stephens Shire consult.  There is no change in the plan of care: aortogram, bilateral leg runoff, and possible intervention.  Laboratory: CBC:    Component Value Date/Time   WBC 7.9 11/14/2014 1633   RBC 4.42 11/14/2014 1633   HGB 13.9 06/19/2016 0850   HCT 41.0 06/19/2016 0850   PLT 206.0 11/14/2014 1633   MCV 88.9 11/14/2014 1633   MCHC 33.5 11/14/2014 1633   RDW 12.9 11/14/2014 1633   LYMPHSABS 2.6 11/14/2014 1633   MONOABS 0.6 11/14/2014 1633   EOSABS 0.2 11/14/2014 1633   BASOSABS 0.0 11/14/2014 1633    BMP:    Component Value Date/Time   NA 143 06/19/2016 0850   K 4.1 06/19/2016 0850   CL 105 06/19/2016 0850   CO2 27 11/14/2014 1633   GLUCOSE 98 06/19/2016 0850   BUN 22* 06/19/2016 0850   CREATININE 1.00 06/19/2016 0850   CALCIUM 9.3 11/14/2014 1633   GFRNONAA >60 11/03/2009 1931   GFRAA  11/03/2009 1931    >60        The eGFR has been calculated using the MDRD equation. This calculation has not been validated in all clinical situations. eGFR's persistently <60 mL/min signify possible Chronic Kidney Disease.    Coagulation: Lab Results  Component Value Date   INR 0.97 11/03/2009   No results found for: PTT  Lipids: No results found for: CHOL, TRIG, HDL, CHOLHDL, VLDL, LDLCALC, LDLDIRECT    Adele Barthel, MD [facility] [employer] Office: [phone] Pager: [phone]  06/19/2016, 9:58 AM

## 2016-06-19 NOTE — Progress Notes (Signed)
Dr Scot Dock notified of dime size area of blood on right groin dressing after client walked; right groin bruised; Dr Scot Dock in to see client and ok to d/c home

## 2016-06-19 NOTE — Progress Notes (Signed)
   Daily Progress Note  Moderate sized hematoma reported developed after sheath pull.  Pt denies any abd pain.  VS stable throughout.  On exam, minimal hematoma at this point with echymosis at cannulation site, no tenderness in RLQ abd.  Filed Vitals:   06/19/16 1400 06/19/16 1405 06/19/16 1410 06/19/16 1415  BP:  149/69 147/72 133/72  Pulse: 64 61 66 58  Temp:      TempSrc:      Resp: 18 11 17 15   Height:      Weight:      SpO2: 94% 97% 96% 95%    - likely cannulation hematoma related to use of 7-Fr sheath and 300 mg of Plavix given - no PSA or AVF evident, ok to discharge as previously ordered  Adele Barthel, MD [facility] of [place] Office: [phone] Pager: [phone]  06/19/2016, 2:27 PM

## 2016-06-19 NOTE — Progress Notes (Addendum)
Site area: left groin a 5 french arterial sheath was removed  Site Prior to Removal:  Level 0  Pressure Applied For 15  MINUTES    Bedrest Beginning at 1400p Manual:   Yes.    Patient Status During Pull:  stable  Post Pull Groin Site:  Level 0  Post Pull Instructions Given:  Yes.    Post Pull Pulses Present:  Yes.    Dressing Applied:  Yes.    Comments:  VS remain stable during sheath pull

## 2016-06-19 NOTE — Progress Notes (Addendum)
Site area: rt groin sheath pulled at 1235 Site Prior to Removal:  Level  0 Pressure Applied For: 60 minutes Manual:   yes Patient Status During Pull:  stable Post Pull Site:  Level  1 semi firm hematoma above site approx 6 inches, light bruising present Post Pull Instructions Given:  yes Post Pull Pulses Present: yes Dressing Applied:  tegaderm Bedrest begins @ dressed at 1335 Comments: 1330-Dr. Chen in to look at rt groin. RCox holding manual pressure to rt groin. Bruising extending to below site and turning darker . C/O burning below and medial to site-pressure held there too. Medicated for pain.

## 2016-06-19 NOTE — H&P (View-Only) (Signed)
Vascular and Vein Specialist of [place]  Patient name: Gary Choi MRN: [id number] DOB: 1941/02/28 Sex: male  REFERRING PHYSICIAN: Dr. Dema Severin  REASON FOR CONSULT: Right leg pain  HPI: Gary Choi is a 75 y.o. 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  The patient has been struggling with right hip pain as well as pain in his right leg and lateral thigh.  It is exacerbated with walking.  It improves shortly after walking.  He does have chronic back pain.  He recently underwent a CT scan which showed a right iliac occlusion.  He denies rest pain or nonhealing wounds.  The patient suffers from hypercholesterolemia which is treated with a statin.  His most recent LDL was 109.  He has a history of prostate cancer treated with radiation seeds.  He is also undergone partial colectomy and chemotherapy for colon cancer.  He is a nonsmoker.  Past Medical History  Diagnosis Date  . Cancer [facility])     prostate  . Colon cancer ([place])   . Hyperlipidemia     History reviewed. No pertinent family history.  SOCIAL HISTORY: Social History   Social History  . Marital Status: Single    Spouse Name: N/A  . Number of Children: N/A  . Years of Education: N/A   Occupational History  . Not on file.   Social History Main Topics  . Smoking status: Former Smoker -- 1.00 packs/day for 45 years    Types: Cigarettes    Quit date: 06/07/2011  . Smokeless tobacco: Not on file  . Alcohol Use: 0.0 oz/week    0 Standard drinks or equivalent per week  . Drug Use: Not on file  . Sexual Activity: Not on file   Other Topics Concern  . Not on file   Social History Narrative   Tobacco use cigarettes: Former smoker ,quit in year, 2000 , Pack year Hx 40 - Tobacco history 07/28/2014. No smoking quit 2000 Alcohol : yes , Rare beer No Recreational drug use Exercise active Occupation retired from [employer]. 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 Lunesta [Eszopiclone] Other (See Comments)    Not effective    Current Outpatient Prescriptions  Medication Sig Dispense Refill  . atorvastatin (LIPITOR) 20 MG tablet Take 20 mg by mouth daily.    Marland Kitchen zolpidem (AMBIEN CR) 12.5 MG CR tablet Take 12.5 mg by mouth at bedtime as needed for sleep.    Marland Kitchen amLODipine (NORVASC) 5 MG tablet Take 1 tablet (5 mg total) by mouth daily. (Patient not taking: Reported on 06/06/2016) 90 tablet 3  . Triprolidine-Pseudoephedrine (ANTIHISTAMINE PO) Take by mouth as directed. Reported on 06/06/2016     No current facility-administered medications for this visit.    REVIEW OF SYSTEMS:  [X]  denotes positive finding, [ ]  denotes negative finding Cardiac  Comments:  Chest pain or chest pressure:    Shortness of breath upon exertion:    Short of breath when lying flat:    Irregular heart rhythm:        Vascular    Pain in calf, thigh, or hip brought on by ambulation: x   Pain in feet at night that wakes you up from your sleep:     Blood clot in your veins:    Leg swelling:  Pulmonary    Oxygen at home:    Productive cough:     Wheezing:         Neurologic    Sudden weakness in arms or legs:     Sudden numbness in arms or legs:     Sudden onset of difficulty speaking or slurred speech:    Temporary loss of vision in one eye:     Problems with dizziness:         Gastrointestinal    Blood in stool:     Vomited blood:         Genitourinary    Burning when urinating:     Blood in urine:        Psychiatric    Major depression:         Hematologic    Bleeding problems:    Problems with blood clotting too easily:        Skin    Rashes or ulcers:        Constitutional    Fever or chills:      PHYSICAL EXAM: Filed Vitals:   06/06/16 1234 06/06/16 1238  BP: 152/86 160/85  Pulse: 73   Temp: 97.2 F (36.2 C)   TempSrc:  Oral   Resp: 20   Height: 6' 0.25" (1.835 m)   Weight: 188 lb 14.4 oz (85.684 kg)     GENERAL: The patient is a well-nourished male, in no acute distress. The vital signs are documented above. CARDIAC: There is a regular rate and rhythm.  VASCULAR: Palpable left pedal pulse, nonpalpable right.  No carotid bruits PULMONARY: There is good air exchange bilaterally without wheezing or rales. ABDOMEN: Soft and non-tender with normal pitched bowel sounds.  MUSCULOSKELETAL: There are no major deformities or cyanosis. NEUROLOGIC: No focal weakness or paresthesias are detected. SKIN: There are no ulcers or rashes noted. PSYCHIATRIC: The patient has a normal affect.  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  No significant outflow or runoff disease was identified  MEDICAL ISSUES: Right leg claudication: I suspect the patient's right hip pain and leg pain are secondary to his right iliac occlusion.  We discussed proceeding with an attempt at percutaneous revascularization.  This is been scheduled for Tuesday, July 18.  I will plan on initial access in the left groin for diagnostic imaging and then an attempt at recannulization through a cannulation of the right groin.  We discussed the risks of the procedure including the risk of dissection, and embolization as well as bleeding, and addition to the possibility that the lesion may not be able to be crossed.  All his questions were answered.  I am starting him on a baby aspirin today.  I will try to move his procedure op if there are cancellations.   Annamarie Major, MD [facility] of [facility] [phone] Pager [phone]

## 2016-06-19 NOTE — Discharge Instructions (Signed)

## 2016-06-20 ENCOUNTER — Encounter (HOSPITAL_COMMUNITY): Payer: Self-pay | Admitting: Vascular Surgery

## 2016-06-20 ENCOUNTER — Other Ambulatory Visit: Payer: Self-pay | Admitting: *Deleted

## 2016-06-20 DIAGNOSIS — I739 Peripheral vascular disease, unspecified: Secondary | ICD-10-CM

## 2016-06-23 ENCOUNTER — Other Ambulatory Visit: Payer: Self-pay | Admitting: *Deleted

## 2016-06-23 ENCOUNTER — Telehealth: Payer: Self-pay | Admitting: Vascular Surgery

## 2016-06-23 ENCOUNTER — Telehealth: Payer: Self-pay

## 2016-06-23 DIAGNOSIS — T148XXA Other injury of unspecified body region, initial encounter: Secondary | ICD-10-CM

## 2016-06-23 NOTE — Telephone Encounter (Signed)
Sched appt 8/1 at 12:00 and MD 8/11 at 11:45. Arbie Cookey added on appt for him 7/21 at 9:45. Pt will get post op appts then.

## 2016-06-23 NOTE — Telephone Encounter (Signed)
-----   Message from Mena Goes, RN sent at 06/23/2016  1:00 PM EDT ----- Regarding: schedule pseudoaneurysm duplex by Friday   ----- Message -----    From: Conrad Bonfield, MD    Sent: 06/23/2016  12:19 PM      To: Vvs Charge Pool  Gary Choi [id number] Mar 07, 1941  Pt needs R groin duplex to rule out PSA prior to appt on Friday

## 2016-06-23 NOTE — Telephone Encounter (Signed)
-----   Message from Mena Goes, RN sent at 06/20/2016 12:42 PM EDT ----- Regarding: 4 weeks postop and ABIs   ----- Message -----    From: Conrad Ochelata, MD    Sent: 06/19/2016   1:09 PM      To: Vvs Charge Pool  Gary Choi [id number] 01-17-1941  PROCEDURE: 1.  Bilateral common femoral artery cannulation under ultrasound guidance 2.  Placement of catheter in aorta 3.  Aortogram 4.  Conscious sedation for 50 minutes 5.  Subintimal angioplasty and stenting right common iliac artery (Lifestream 8.0 mm x 35 mm) 6.  Bilateral leg runoff via catheter  Follow-up: 4 weeks  Orders for follow-up: BLE ABI

## 2016-06-23 NOTE — Telephone Encounter (Signed)
Added lab at 10:30 on 7/21, resched MD appt from 9:45 to 11:45. Spoke to pt to inform them of change.

## 2016-06-23 NOTE — Progress Notes (Signed)
EMR caused chart duplicate   This encounter was created in error - please disregard.

## 2016-06-23 NOTE — Telephone Encounter (Signed)
-----   Message from Mena Goes, RN sent at 06/20/2016 12:42 PM EDT ----- Regarding: 4 weeks postop and ABIs   ----- Message -----    From: Conrad [place], MD    Sent: 06/19/2016   1:09 PM      To: Vvs Charge Pool  Kary Frontino [id number] 12/31/1940  PROCEDURE: 1.  Bilateral common femoral artery cannulation under ultrasound guidance 2.  Placement of catheter in aorta 3.  Aortogram 4.  Conscious sedation for 50 minutes 5.  Subintimal angioplasty and stenting right common iliac artery (Lifestream 8.0 mm x 35 mm) 6.  Bilateral leg runoff via catheter  Follow-up: 4 weeks  Orders for follow-up: BLE ABI

## 2016-06-23 NOTE — Telephone Encounter (Signed)
-----   Message from Mena Goes, RN sent at 06/20/2016 12:42 PM EDT ----- Regarding: 4 weeks postop and ABIs   ----- Message -----    From: Conrad [place], MD    Sent: 06/19/2016   1:09 PM      To: Vvs Charge Pool  Aanay Marrison [id number] [birth year]  PROCEDURE: 1.  Bilateral common femoral artery cannulation under ultrasound guidance 2.  Placement of catheter in aorta 3.  Aortogram 4.  Conscious sedation for 50 minutes 5.  Subintimal angioplasty and stenting right common iliac artery (Lifestream 8.0 mm x 35 mm) 6.  Bilateral leg runoff via catheter  Follow-up: 4 weeks  Orders for follow-up: BLE ABI

## 2016-06-23 NOTE — Telephone Encounter (Signed)
Informed pt of appts: Lab 8/1 at 12:00 and MD 8/11 at 11:45.

## 2016-06-23 NOTE — Telephone Encounter (Signed)
Phone call from pt.  Reported "massive bruising with a dark purple discoloration from the knee up to the right hip, and side of the right hip."  Reported swelling in the groin.  Stated it is still "very  tender, and the catheter insertion site is still raw"; stated sm. amt. of bloody drainage on bandage when changed.  Pt. stated the swelling is not improving.  Reported he has been putting Neosporin on the open area of groin.  Advised to keep groin clean / dry; encouraged to clean with an antibacterial soap, apply dry bandage; avoid getting in bathtub; advised showers only; advised to monitor for signs of infection.  Discussed with Dr. Trula Slade.  Advised to have pt. continue to monitor bruising, and to give it more time for resolution or improvement.  Recommended to schedule pt. For appt. With Dr. Bridgett Larsson this week.  Contacted pt. With Dr. Stephens Shire recommendations.  Advised of appt. 06/27/16 @ 9:45 AM; verb. Understanding of instructions/ agreed with plan.

## 2016-06-25 ENCOUNTER — Ambulatory Visit: Payer: Medicare Other | Admitting: Acute Care

## 2016-06-27 ENCOUNTER — Encounter: Payer: Medicare Other | Admitting: Vascular Surgery

## 2016-06-27 ENCOUNTER — Ambulatory Visit (INDEPENDENT_AMBULATORY_CARE_PROVIDER_SITE_OTHER): Payer: Medicare Other | Admitting: Vascular Surgery

## 2016-06-27 ENCOUNTER — Encounter: Payer: Self-pay | Admitting: Vascular Surgery

## 2016-06-27 ENCOUNTER — Ambulatory Visit (HOSPITAL_COMMUNITY)
Admission: RE | Admit: 2016-06-27 | Discharge: 2016-06-27 | Disposition: A | Discharge status: Home / Self Care | Payer: Medicare Other | Source: Ambulatory Visit | Attending: Vascular Surgery | Admitting: Vascular Surgery

## 2016-06-27 VITALS — BP 128/83 | HR 88 | Temp 97.1°F | Ht 73.0 in | Wt 188.0 lb

## 2016-06-27 DIAGNOSIS — E785 Hyperlipidemia, unspecified: Secondary | ICD-10-CM | POA: Diagnosis not present

## 2016-06-27 DIAGNOSIS — T148 Other injury of unspecified body region: Secondary | ICD-10-CM

## 2016-06-27 DIAGNOSIS — T148XXA Other injury of unspecified body region, initial encounter: Secondary | ICD-10-CM

## 2016-06-27 DIAGNOSIS — I745 Embolism and thrombosis of iliac artery: Secondary | ICD-10-CM

## 2016-06-27 DIAGNOSIS — R1909 Other intra-abdominal and pelvic swelling, mass and lump: Secondary | ICD-10-CM | POA: Diagnosis not present

## 2016-06-27 NOTE — Progress Notes (Signed)
    Postoperative Visit   History of Present Illness  Gary Choi is a 75 y.o. male who presents for postoperative follow-up from procedure on Date: 06/19/16: SIA+S R CIA .  The patient develop pain at R cannulation site with development of hematoma and draining echymosis onto his R thigh.  His ambulation has been limited by his R groin pain.  Current Outpatient Prescriptions  Medication Sig Dispense Refill  . atorvastatin (LIPITOR) 20 MG tablet Take 20 mg by mouth daily.    . clopidogrel (PLAVIX) 75 MG tablet Take 1 tablet (75 mg total) by mouth daily. 30 tablet 11  . Triprolidine-Pseudoephedrine (ANTIHISTAMINE PO) Take 1 tablet by mouth daily as needed (allergy symptoms). Reported on 06/06/2016    . zolpidem (AMBIEN CR) 12.5 MG CR tablet Take 12.5 mg by mouth at bedtime as needed for sleep.    Marland Kitchen amLODipine (NORVASC) 5 MG tablet Take 1 tablet (5 mg total) by mouth daily. (Patient not taking: Reported on 06/06/2016) 90 tablet 3   No current facility-administered medications for this visit.    For VQI Use Only  PRE-ADM LIVING: Home  AMB STATUS: Ambulatory  Physical Examination  Filed Vitals:   06/27/16 1123  BP: 128/83  Pulse: 88  Temp: 97.1 F (36.2 C)  TempSrc: Oral  Height: 6\' 1"  (1.854 m)  Weight: 188 lb (85.276 kg)  SpO2: 97%   Body mass index is 24.81 kg/(m^2).  R Groin: +palpable hematoma, with palpable pulse, +echymosis in anterior thigh  R groin duplex (06/27/16): hematomas in right thigh, largest 3.6 cm x 1.2 cm   Medical Decision Making  Gary Choi is a 75 y.o. male who presents s/p SIA+S R CIA CTO complicated with access site hematoma  Expect this patient's hematoma and echymosis to resolve over the next 2-4 weeks. 7-Fr sheath, plavix bolus, and recannulation of occluded CIA likely contributed to this hematoma occurring Warm compress and NSAID may help with sx. Based on his angiographic findings, this patient needs: q3 month ABI and Aortoiliac  duplex I discussed in depth with the patient the nature of atherosclerosis, and emphasized the importance of maximal medical management including strict control of blood pressure, blood glucose, and lipid levels, obtaining regular exercise, and cessation of smoking.  The patient is aware that without maximal medical management the underlying atherosclerotic disease process will progress, limiting the benefit of any interventions. The patient is currently on a statin: Lipitor. physician.   The patient is currently on an anti-platelet: Plavix.  Thank you for allowing [place] to participate in this patient's care.  Adele Barthel, MD, FACS [facility] Office: [phone] Pager: [phone]

## 2016-07-08 ENCOUNTER — Encounter (HOSPITAL_COMMUNITY): Payer: Medicare Other

## 2016-07-18 ENCOUNTER — Ambulatory Visit: Payer: Medicare Other | Admitting: Vascular Surgery

## 2016-07-18 DIAGNOSIS — Z961 Presence of intraocular lens: Secondary | ICD-10-CM | POA: Diagnosis not present

## 2016-07-18 DIAGNOSIS — H04123 Dry eye syndrome of bilateral lacrimal glands: Secondary | ICD-10-CM | POA: Diagnosis not present

## 2016-09-16 DIAGNOSIS — Z85828 Personal history of other malignant neoplasm of skin: Secondary | ICD-10-CM | POA: Diagnosis not present

## 2016-09-16 DIAGNOSIS — L570 Actinic keratosis: Secondary | ICD-10-CM | POA: Diagnosis not present

## 2016-09-16 DIAGNOSIS — Z08 Encounter for follow-up examination after completed treatment for malignant neoplasm: Secondary | ICD-10-CM | POA: Diagnosis not present

## 2016-09-22 ENCOUNTER — Encounter: Payer: Self-pay | Admitting: Vascular Surgery

## 2016-09-23 ENCOUNTER — Encounter (HOSPITAL_COMMUNITY): Payer: Self-pay | Admitting: *Deleted

## 2016-09-25 ENCOUNTER — Other Ambulatory Visit: Payer: Self-pay | Admitting: *Deleted

## 2016-09-25 DIAGNOSIS — I739 Peripheral vascular disease, unspecified: Secondary | ICD-10-CM

## 2016-09-25 DIAGNOSIS — Z9862 Peripheral vascular angioplasty status: Secondary | ICD-10-CM

## 2016-09-25 NOTE — Progress Notes (Signed)
Established Intermittent Claudication  History of Present Illness  Gary Choi is a 75 y.o. (1940/12/21) male who presents with chief complaint: routine follow up.  Pt is s/p SIA+S R CIA for R CIA CTO with intermittent claudication.  The patient's symptoms have completely resolved.  The patient's treatment regimen currently included: maximal medical management and walking plan.  The patient's PMH, PSH, and SH, and FamHx are unchanged from 06/27/16.  Current Outpatient Prescriptions  Medication Sig Dispense Refill  . amLODipine (NORVASC) 5 MG tablet Take 1 tablet (5 mg total) by mouth daily. (Patient not taking: Reported on 06/06/2016) 90 tablet 3  . atorvastatin (LIPITOR) 20 MG tablet Take 20 mg by mouth daily.    . clopidogrel (PLAVIX) 75 MG tablet Take 1 tablet (75 mg total) by mouth daily. 30 tablet 11  . Triprolidine-Pseudoephedrine (ANTIHISTAMINE PO) Take 1 tablet by mouth daily as needed (allergy symptoms). Reported on 06/06/2016    . zolpidem (AMBIEN CR) 12.5 MG CR tablet Take 12.5 mg by mouth at bedtime as needed for sleep.     No current facility-administered medications for this visit.     No Known Allergies  On ROS today: no intermittent claudication , resolution of prior echymosis.   Physical Examination  Vitals:   09/26/16 0917 09/26/16 0919  BP: (!) 156/91 (!) 158/88  Pulse: 65   Resp: 16   Temp: 97.3 F (36.3 C)   TempSrc: Oral   SpO2: 98%   Weight: 189 lb (85.7 kg)   Height: 6\' 1"  (1.854 m)     Body mass index is 24.94 kg/m.  General: Alert, O x 3, WD,NAD  Pulmonary: Sym exp, good B air movt,CTA B  Cardiac: RRR, Nl S1, S2, no Murmurs, No rubs, No S3,S4  Vascular: Vessel Right Left  Radial Palpable Palpable  Brachial Palpable Palpable  Carotid Palpable, No Bruit Palpable, No Bruit  Aorta Not palpable N/A  Femoral Palpable Palpable  Popliteal Not palpable Not palpable  PT Palpable Palpable  DP Palpable Palpable   Gastrointestinal: soft,  non-distended, non-tender to palpation, No guarding or rebound, no HSM, no masses, no CVAT B, No palpable prominent aortic pulse,    Musculoskeletal: M/S 5/5 throughout  , Extremities without ischemic changes , No edema present, No LDS present  Neurologic: CN 2-12 intact , Pain and light touch intact in extremities , Motor exam as listed above   Non-Invasive Vascular Imaging ABI (Date: 09/25/2016)  R:   ABI: 1.14,   DP: tri  PT: tri  TBI:  1.05  L:   ABI: 1.07,   DP: bi  PT: tri  TBI: 1.39   Medical Decision Making  Gary Choi is a 75 y.o. male who presents with:  right leg intermittent claudication without evidence of critical limb ischemia s/p R SIA+S.   Based on the patient's vascular studies and examination, I have offered the patient: q6 month ABI and aortoiliac duplex.  I discussed in depth with the patient the nature of atherosclerosis, and emphasized the importance of maximal medical management including strict control of blood pressure, blood glucose, and lipid levels, antiplatelet agents, obtaining regular exercise, and cessation of smoking.    The patient is aware that without maximal medical management the underlying atherosclerotic disease process will progress, limiting the benefit of any interventions. The patient is currently on a statin:  Lipitor. The patient is currently on an anti-platelet: Plavix.  Thank you for allowing [place] to participate in this patient's care.  Adele Barthel, MD, FACS [facility] Office: [phone] Pager: [phone]

## 2016-09-26 ENCOUNTER — Ambulatory Visit (INDEPENDENT_AMBULATORY_CARE_PROVIDER_SITE_OTHER)
Admission: RE | Admit: 2016-09-26 | Discharge: 2016-09-26 | Disposition: A | Discharge status: Home / Self Care | Payer: Medicare Other | Source: Ambulatory Visit | Attending: Vascular Surgery | Admitting: Vascular Surgery

## 2016-09-26 ENCOUNTER — Encounter: Payer: Self-pay | Admitting: Vascular Surgery

## 2016-09-26 ENCOUNTER — Ambulatory Visit (HOSPITAL_COMMUNITY)
Admission: RE | Admit: 2016-09-26 | Discharge: 2016-09-26 | Disposition: A | Discharge status: Home / Self Care | Payer: Medicare Other | Source: Ambulatory Visit | Attending: Vascular Surgery | Admitting: Vascular Surgery

## 2016-09-26 ENCOUNTER — Ambulatory Visit (INDEPENDENT_AMBULATORY_CARE_PROVIDER_SITE_OTHER): Payer: Medicare Other | Admitting: Vascular Surgery

## 2016-09-26 VITALS — BP 158/88 | HR 65 | Temp 97.3°F | Resp 16 | Ht 73.0 in | Wt 189.0 lb

## 2016-09-26 DIAGNOSIS — I739 Peripheral vascular disease, unspecified: Secondary | ICD-10-CM

## 2016-09-26 DIAGNOSIS — I745 Embolism and thrombosis of iliac artery: Secondary | ICD-10-CM | POA: Diagnosis not present

## 2016-09-26 DIAGNOSIS — Z9862 Peripheral vascular angioplasty status: Secondary | ICD-10-CM | POA: Diagnosis not present

## 2016-10-10 DIAGNOSIS — Z8546 Personal history of malignant neoplasm of prostate: Secondary | ICD-10-CM | POA: Diagnosis not present

## 2016-10-10 DIAGNOSIS — N528 Other male erectile dysfunction: Secondary | ICD-10-CM | POA: Diagnosis not present

## 2016-10-10 DIAGNOSIS — N4340 Spermatocele of epididymis, unspecified: Secondary | ICD-10-CM | POA: Diagnosis not present

## 2016-10-10 DIAGNOSIS — C61 Malignant neoplasm of prostate: Secondary | ICD-10-CM | POA: Diagnosis not present

## 2016-10-21 NOTE — Addendum Note (Signed)
Addended by: Thresa Ross C on: 10/21/2016 02:21 PM   Modules accepted: Orders

## 2017-02-18 DIAGNOSIS — R51 Headache: Secondary | ICD-10-CM | POA: Diagnosis not present

## 2017-02-20 ENCOUNTER — Other Ambulatory Visit: Payer: Self-pay | Admitting: Family Medicine

## 2017-02-20 DIAGNOSIS — R51 Headache: Principal | ICD-10-CM

## 2017-02-20 DIAGNOSIS — R519 Headache, unspecified: Secondary | ICD-10-CM

## 2017-02-25 ENCOUNTER — Ambulatory Visit
Admission: RE | Admit: 2017-02-25 | Discharge: 2017-02-25 | Disposition: A | Discharge status: Home / Self Care | Payer: Medicare Other | Source: Ambulatory Visit | Attending: Family Medicine | Admitting: Family Medicine

## 2017-02-25 DIAGNOSIS — R51 Headache: Principal | ICD-10-CM

## 2017-02-25 DIAGNOSIS — R519 Headache, unspecified: Secondary | ICD-10-CM

## 2017-02-25 MED ORDER — IOPAMIDOL (ISOVUE-300) INJECTION 61%
75.0000 mL | Freq: Once | INTRAVENOUS | Status: AC | PRN
  Administered 2017-02-25: 75 mL via INTRAVENOUS

## 2017-03-02 ENCOUNTER — Ambulatory Visit (INDEPENDENT_AMBULATORY_CARE_PROVIDER_SITE_OTHER): Payer: Medicare Other | Admitting: Neurology

## 2017-03-02 ENCOUNTER — Encounter: Payer: Self-pay | Admitting: Neurology

## 2017-03-02 ENCOUNTER — Telehealth: Payer: Self-pay | Admitting: Neurology

## 2017-03-02 DIAGNOSIS — R51 Headache: Secondary | ICD-10-CM | POA: Diagnosis not present

## 2017-03-02 DIAGNOSIS — R52 Pain, unspecified: Secondary | ICD-10-CM | POA: Diagnosis not present

## 2017-03-02 DIAGNOSIS — R519 Headache, unspecified: Secondary | ICD-10-CM | POA: Insufficient documentation

## 2017-03-02 DIAGNOSIS — D329 Benign neoplasm of meninges, unspecified: Secondary | ICD-10-CM | POA: Diagnosis not present

## 2017-03-02 MED ORDER — DIAZEPAM 5 MG PO TABS: 5.0000 mg | ORAL_TABLET | Freq: Four times a day (QID) | ORAL | 0 refills | Status: DC | PRN

## 2017-03-02 NOTE — Progress Notes (Signed)
PATIENT: Gary Choi DOB: 18-Nov-1941  Chief Complaint  Patient presents with  . Headache    He has persistent, daily headaches unrelieved by OTC NSAIDS.  He is getting some benefit from his second, oral Prednisone pack.    . Meningioma    He would like to review his CT head results.  Marland Kitchen PCP    Harlan Stains, MD - referred from her office     HISTORICAL  Gary Choi is a 76 years old right-handed male, seen in refer by his primary care physician Dr. Harlan Stains for evaluation of headache, initial evaluation was on March 02 2017.  I reviewed and summarized the referring note, he had a history of previous heavy smoke one pack a day, quit in 2015, he had a history of metastatic cancer to liver and pancreas in 2000, was treated with surgical resection and chemotherapy. This was found during a routine colonoscopy at that time, but per record, initial original site was unknown: Biopsy consistent with tubular adenomatous polyps, hypertension, peripheral vascular disease of right leg, status post right iliac artery stent placement,History of prostate cancer, had radiation seed placement in 2005, hyperlipidemia, early COPD,  He reported a history of occasional headache in the past, in February 2018, he noticed gradually building up of persistent mild to moderate pressure headache, involving different part, no significant light noise sensitivity, no nausea, no throbbing pain. He was able to continue his daily activities such as attending his garden, playing golf without much difficulty.  He was evaluated by Dr. Dema Severin on February 18 2017, normal ESR 4, C-reactive 1.6, normal CMP with exception of mild elevated glucose 126, CBC, with hemoglobin of 15.  We also personally reviewed CT head with and without contrast in March, left frontal falx hemangioma 11 times 15 times time 19 mm.  He was treated with first round of prednisone 50 mg from March 14 to 19. Had a significant improvement of his  headache, but began to have recurrent headaches again, he was started on second round of prednisone 50 mg daily since March 2018 for 5 days,  Few hours after he started prednisone, his headache has much improved. He denies visual loss, no jaw claudication, does complains of mild increased fatigue, no weakness.   REVIEW OF SYSTEMS: Full 14 system review of systems performed and notable only for headache  ALLERGIES: No Known Allergies  HOME MEDICATIONS: Current Outpatient Prescriptions  Medication Sig Dispense Refill  . atorvastatin (LIPITOR) 20 MG tablet Take 20 mg by mouth daily.    . predniSONE (DELTASONE) 50 MG tablet Taper pack - take as directed.    . propranolol ER (INDERAL LA) 80 MG 24 hr capsule Take 80 mg by mouth daily.    . Triprolidine-Pseudoephedrine (ANTIHISTAMINE PO) Take 1 tablet by mouth daily as needed (allergy symptoms). Reported on 06/06/2016    . zolpidem (AMBIEN CR) 12.5 MG CR tablet Take 12.5 mg by mouth at bedtime as needed for sleep.     No current facility-administered medications for this visit.     PAST MEDICAL HISTORY: Past Medical History:  Diagnosis Date  . Cancer [facility])    prostate  . Colon cancer ([place])   . Headache   . Hyperlipidemia   . Meningioma ([place])     PAST SURGICAL HISTORY: Past Surgical History:  Procedure Laterality Date  . COLON RESECTION  2000  . EYE SURGERY Bilateral    cataract  . INSERTION PROSTATE RADIATION SEED  2005  . LIVER  RESECTION  2000  . PERIPHERAL VASCULAR CATHETERIZATION Right 06/19/2016   Procedure: Peripheral Vascular Intervention;  Surgeon: Conrad [place], MD;  Location: [place] [facility];  Service: Cardiovascular;  Laterality: Right;  common iliac  . PERIPHERAL VASCULAR CATHETERIZATION N/A 06/19/2016   Procedure: Abdominal Aortogram;  Surgeon: Conrad , MD;  Location: [facility];  Service: Cardiovascular;  Laterality: N/A;    FAMILY HISTORY: Family History  Problem Relation Age of Onset  . Liver  cancer Mother   . Colon cancer Mother   . Cancer Father     unsure of type    SOCIAL HISTORY:  Social History   Social History  . Marital status: Single    Spouse name: N/A  . Number of children: 2  . Years of education: some college   Occupational History  . Retired    Social History Main Topics  . Smoking status: Former Smoker    Packs/day: 1.00    Years: 45.00    Types: Cigarettes    Quit date: 06/07/2011  . Smokeless tobacco: Never Used  . Alcohol use 0.0 oz/week     Comment: Social  . Drug use: No  . Sexual activity: Not on file   Other Topics Concern  . Not on file   Social History Narrative   Tobacco use cigarettes: Former smoker ,quit in year, 2000 , Pack year Hx 70 - Tobacco history 07/28/2014. No smoking quit 2000 Alcohol : yes , Rare beer No Recreational drug use Exercise active Occupation retired from [employer]. Marital Status  Married  Children 1 son Nicolas -Chiropractor  relgion : not important - Seat belt use : yes   Right-handed.   2 cups caffeine daily.     PHYSICAL EXAM   Vitals:   03/02/17 1110  BP: (!) 170/86  Pulse: (!) 57  Weight: 186 lb (84.4 kg)  Height: '6\' 1"'$  (1.854 m)    Not recorded      Body mass index is 24.54 kg/m.  PHYSICAL EXAMNIATION:  Gen: NAD, conversant, well nourised, obese, well groomed                     Cardiovascular: Regular rate rhythm, no peripheral edema, warm, nontender. Eyes: Conjunctivae clear without exudates or hemorrhage Neck: Supple, no carotid bruits. Pulmonary: Clear to auscultation bilaterally   NEUROLOGICAL EXAM:  MENTAL STATUS: Speech:    Speech is normal; fluent and spontaneous with normal comprehension.  Cognition:     Orientation to time, place and person     Normal recent and remote memory     Normal Attention span and concentration     Normal Language, naming, repeating,spontaneous speech     Fund of knowledge   CRANIAL NERVES: CN II: Visual fields are full  to confrontation. Fundoscopic exam is normal with sharp discs and no vascular changes. Pupils are round equal and briskly reactive to light. CN III, IV, VI: extraocular movement are normal. No ptosis. CN V: Facial sensation is intact to pinprick in all 3 divisions bilaterally. Corneal responses are intact.  CN VII: Face is symmetric with normal eye closure and smile. CN VIII: Hearing is normal to rubbing fingers CN IX, X: Palate elevates symmetrically. Phonation is normal. CN XI: Head turning and shoulder shrug are intact CN XII: Tongue is midline with normal movements and no atrophy.  MOTOR: There is no pronator drift of out-stretched arms. Muscle bulk and tone are normal. Muscle strength is  normal.  REFLEXES: Reflexes are 2+ and symmetric at the biceps, triceps, knees, and ankles. Plantar responses are flexor.  SENSORY: Intact to light touch, pinprick, positional sensation and vibratory sensation are intact in fingers and toes.  COORDINATION: Rapid alternating movements and fine finger movements are intact. There is no dysmetria on finger-to-nose and heel-knee-shin.    GAIT/STANCE: Posture is normal. Gait is steady with normal steps, base, arm swing, and turning. Heel and toe walking are normal. Tandem gait is normal.  Romberg is absent.   DIAGNOSTIC DATA (LABS, IMAGING, TESTING) - I reviewed patient records, labs, notes, testing and imaging myself where available.   ASSESSMENT AND PLAN  Gary Choi is a 76 y.o. male   New onset persistent headache, with history of metastatic cancer to liver, pancreas of unknown origin  MRI of the brain with and without contrast to rule out central nervous system pathology  Normal ESR C-reactive protein, no evidence of temporal arteritis.  As needed NSAIDs, Tylenol after he complete current course of steroids,  Return to clinic in 3 weeks   Marcial Pacas, M.D. Ph.D.  [facility] [address] [place], [place]  [address] Ph: [phone] Fax: [phone]  CC: Harlan Stains, MD

## 2017-03-02 NOTE — Telephone Encounter (Signed)
Patient was scheduled to see Dr. Krista Blue 04/07/17 (3 week fu from today).  Patient called to cancel the appointment and requesting to be seen sooner didn't want to wait 5 weeks to be seen.  Please call

## 2017-03-02 NOTE — Telephone Encounter (Signed)
Spoke to patient - his appt has been moved to an earlier date.

## 2017-03-03 LAB — CREATININE, SERUM
CREATININE: 1.1 mg/dL (ref 0.76–1.27)
GFR calc non Af Amer: 65 mL/min/{1.73_m2} (ref >59)
GFR, EST AFRICAN AMERICAN: 76 mL/min/{1.73_m2} (ref >59)

## 2017-03-03 LAB — ANA W/REFLEX IF POSITIVE: Anti Nuclear Antibody(ANA): NEGATIVE

## 2017-03-03 LAB — TSH: TSH: 1.07 u[IU]/mL (ref 0.450–4.500)

## 2017-03-17 ENCOUNTER — Ambulatory Visit
Admission: RE | Admit: 2017-03-17 | Discharge: 2017-03-17 | Disposition: A | Discharge status: Home / Self Care | Payer: Medicare Other | Source: Ambulatory Visit | Attending: Neurology | Admitting: Neurology

## 2017-03-17 DIAGNOSIS — R51 Headache: Secondary | ICD-10-CM | POA: Diagnosis not present

## 2017-03-17 DIAGNOSIS — D329 Benign neoplasm of meninges, unspecified: Secondary | ICD-10-CM

## 2017-03-17 DIAGNOSIS — D320 Benign neoplasm of cerebral meninges: Secondary | ICD-10-CM | POA: Diagnosis not present

## 2017-03-17 DIAGNOSIS — R519 Headache, unspecified: Secondary | ICD-10-CM

## 2017-03-17 MED ORDER — GADOBENATE DIMEGLUMINE 529 MG/ML IV SOLN
18.0000 mL | Freq: Once | INTRAVENOUS | Status: AC | PRN
  Administered 2017-03-17: 18 mL via INTRAVENOUS

## 2017-03-25 ENCOUNTER — Ambulatory Visit (INDEPENDENT_AMBULATORY_CARE_PROVIDER_SITE_OTHER): Payer: Medicare Other | Admitting: Neurology

## 2017-03-25 ENCOUNTER — Encounter: Payer: Self-pay | Admitting: Neurology

## 2017-03-25 VITALS — BP 157/87 | HR 77 | Ht 73.0 in | Wt 190.0 lb

## 2017-03-25 DIAGNOSIS — G4451 Hemicrania continua: Secondary | ICD-10-CM

## 2017-03-25 DIAGNOSIS — D329 Benign neoplasm of meninges, unspecified: Secondary | ICD-10-CM

## 2017-03-25 DIAGNOSIS — R51 Headache: Secondary | ICD-10-CM | POA: Diagnosis not present

## 2017-03-25 DIAGNOSIS — R519 Headache, unspecified: Secondary | ICD-10-CM

## 2017-03-25 DIAGNOSIS — R52 Pain, unspecified: Secondary | ICD-10-CM | POA: Diagnosis not present

## 2017-03-25 MED ORDER — DILTIAZEM HCL ER COATED BEADS 120 MG PO CP24: 120.0000 mg | ORAL_CAPSULE | Freq: Every day | ORAL | 11 refills | Status: DC

## 2017-03-25 MED ORDER — INDOMETHACIN 50 MG PO CAPS: 50.0000 mg | ORAL_CAPSULE | Freq: Two times a day (BID) | ORAL | 6 refills | Status: DC

## 2017-03-25 NOTE — Progress Notes (Signed)
PATIENT: Gary Choi DOB: Aug 06, 1941  Chief Complaint  Patient presents with  . Meningioma/Headache    He would like to review his MRI results.     HISTORICAL  Gary Choi is a 76 years old right-handed male, seen in refer by his primary care physician Dr. Harlan Stains for evaluation of headache, initial evaluation was on March 02 2017.  I reviewed and summarized the referring note, he had a history of previous heavy smoke one pack a day, quit in 2015, he had a history of metastatic cancer to liver and pancreas in 2000, was treated with surgical resection and chemotherapy. This was found during a routine colonoscopy at that time, but per record, initial original site was unknown: Biopsy consistent with tubular adenomatous polyps, hypertension, peripheral vascular disease of right leg, status post right iliac artery stent placement,History of prostate cancer, had radiation seed placement in 2005, hyperlipidemia, early COPD,  He reported a history of occasional headache in the past, in February 2018, he noticed gradually building up of persistent mild to moderate pressure headache, involving different part, no significant light noise sensitivity, no nausea, no throbbing pain. He was able to continue his daily activities such as attending his garden, playing golf without much difficulty.  He was evaluated by Dr. Dema Severin on February 18 2017, normal ESR 4, C-reactive 1.6, normal CMP with exception of mild elevated glucose 126, CBC, with hemoglobin of 15.  We also personally reviewed CT head with and without contrast in March, left frontal falx hemangioma 11 times 15 times time 19 mm.  He was treated with first round of prednisone 50 mg from March 14 to 19. Had a significant improvement of his headache, but began to have recurrent headaches again, he was started on second round of prednisone 50 mg daily since March 2018 for 5 days,  Few hours after he started prednisone, his headache has much  improved. He denies visual loss, no jaw claudication, does complains of mild increased fatigue, no weakness.   Update March 25 2017,  We have personally reviewed MRI of the brain with and without contrast on March 17 2017, left parasagittal meningioma 2x1.5x2 cm meningioma, mild periventricular gliosis.  Small left posterior temporal/occipital chronic cerebral microhemorrhage.  He still have persistent mild to moderate left frontal area pressure headache, has been taking Tylenol 500 mg 3 tablets daily for headache control, he also has watering eyes, left worse than right.  REVIEW OF SYSTEMS: Full 14 system review of systems performed and notable only for headache  ALLERGIES: No Known Allergies  HOME MEDICATIONS: Current Outpatient Prescriptions  Medication Sig Dispense Refill  . atorvastatin (LIPITOR) 20 MG tablet Take 20 mg by mouth daily.    Marland Kitchen zolpidem (AMBIEN CR) 12.5 MG CR tablet Take 12.5 mg by mouth at bedtime as needed for sleep.     No current facility-administered medications for this visit.     PAST MEDICAL HISTORY: Past Medical History:  Diagnosis Date  . Cancer [facility])    prostate  . Colon cancer ([place])   . Headache   . Hyperlipidemia   . Meningioma ([place])     PAST SURGICAL HISTORY: Past Surgical History:  Procedure Laterality Date  . COLON RESECTION  2000  . EYE SURGERY Bilateral    cataract  . INSERTION PROSTATE RADIATION SEED  2005  . LIVER RESECTION  2000  . PERIPHERAL VASCULAR CATHETERIZATION Right 06/19/2016   Procedure: Peripheral Vascular Intervention;  Surgeon: Conrad [place], MD;  Location[facility]  INVASIVE CV [facility];  Service: Cardiovascular;  Laterality: Right;  common iliac  . PERIPHERAL VASCULAR CATHETERIZATION N/A 06/19/2016   Procedure: Abdominal Aortogram;  Surgeon: Conrad [place], MD;  Location: [place] [facility];  Service: Cardiovascular;  Laterality: N/A;    FAMILY HISTORY: Family History  Problem Relation Age of Onset  . Liver cancer Mother   . Colon  cancer Mother   . Cancer Father     unsure of type    SOCIAL HISTORY:  Social History   Social History  . Marital status: Single    Spouse name: N/A  . Number of children: 2  . Years of education: some college   Occupational History  . Retired    Social History Main Topics  . Smoking status: Former Smoker    Packs/day: 1.00    Years: 45.00    Types: Cigarettes    Quit date: 06/07/2011  . Smokeless tobacco: Never Used  . Alcohol use 0.0 oz/week     Comment: Social  . Drug use: No  . Sexual activity: Not on file   Other Topics Concern  . Not on file   Social History Narrative   Tobacco use cigarettes: Former smoker ,quit in year, 2000 , Pack year Hx 80 - Tobacco history 07/28/2014. No smoking quit 2000 Alcohol : yes , Rare beer No Recreational drug use Exercise active Occupation retired from [employer]. Marital Status  Married  Children 1 son Mauro -Chiropractor  relgion : not important - Seat belt use : yes   Right-handed.   2 cups caffeine daily.     PHYSICAL EXAM   Vitals:   03/25/17 1556  BP: (!) 157/87  Pulse: 77  Weight: 190 lb (86.2 kg)  Height: _0  (1.854 m)    Not recorded      Body mass index is 25.07 kg/m.  PHYSICAL EXAMNIATION:  Gen: NAD, conversant, well nourised, obese, well groomed                     Cardiovascular: Regular rate rhythm, no peripheral edema, warm, nontender. Eyes: Conjunctivae clear without exudates or hemorrhage Neck: Supple, no carotid bruits. Pulmonary: Clear to auscultation bilaterally   NEUROLOGICAL EXAM:  MENTAL STATUS: Speech:    Speech is normal; fluent and spontaneous with normal comprehension.  Cognition:     Orientation to time, place and person     Normal recent and remote memory     Normal Attention span and concentration     Normal Language, naming, repeating,spontaneous speech     Fund of knowledge   CRANIAL NERVES: CN II: Visual fields are full to confrontation. Fundoscopic  exam is normal with sharp discs and no vascular changes. Pupils are round equal and briskly reactive to light. CN III, IV, VI: extraocular movement are normal. No ptosis. CN V: Facial sensation is intact to pinprick in all 3 divisions bilaterally. Corneal responses are intact.  CN VII: Face is symmetric with normal eye closure and smile. CN VIII: Hearing is normal to rubbing fingers CN IX, X: Palate elevates symmetrically. Phonation is normal. CN XI: Head turning and shoulder shrug are intact CN XII: Tongue is midline with normal movements and no atrophy.  MOTOR: There is no pronator drift of out-stretched arms. Muscle bulk and tone are normal. Muscle strength is normal.  REFLEXES: Reflexes are 2+ and symmetric at the biceps, triceps, knees, and ankles. Plantar responses are flexor.  SENSORY: Intact to light touch, pinprick,  positional sensation and vibratory sensation are intact in fingers and toes.  COORDINATION: Rapid alternating movements and fine finger movements are intact. There is no dysmetria on finger-to-nose and heel-knee-shin.    GAIT/STANCE: Posture is normal. Gait is steady with normal steps, base, arm swing, and turning. Heel and toe walking are normal. Tandem gait is normal.  Romberg is absent.   DIAGNOSTIC DATA (LABS, IMAGING, TESTING) - I reviewed patient records, labs, notes, testing and imaging myself where available.   ASSESSMENT AND PLAN  Gary Choi is a 76 y.o. male   New onset persistent headache, with history of metastatic cancer to liver, pancreas of unknown origin  MRI of the brain with and without contrast: Showed evidence of small meningioma,   Normal ESR C-reactive protein, in the past, may consider repeat ESR C-reactive protein,   his headache has some features of hemicranium continuum, will try low-dose indomethacin 50 mg twice a day as needed, Cardizem ER 120 mg every day as preventive medications  Return to clinic in one month   Marcial Pacas,  M.D. Ph.D.  [facility] [address] [place], [place] [address] Ph: [phone] Fax: [phone]  CC: Harlan Stains, MD

## 2017-03-26 ENCOUNTER — Telehealth: Payer: Self-pay | Admitting: *Deleted

## 2017-03-26 LAB — COMPREHENSIVE METABOLIC PANEL
ALBUMIN: 4.4 g/dL (ref 3.5–4.8)
ALT: 20 IU/L (ref 0–44)
AST: 15 IU/L (ref 0–40)
Albumin/Globulin Ratio: 1.9 (ref 1.2–2.2)
Alkaline Phosphatase: 121 IU/L — ABNORMAL HIGH (ref 39–117)
BUN/Creatinine Ratio: 21 (ref 10–24)
BUN: 23 mg/dL (ref 8–27)
Bilirubin Total: 0.3 mg/dL (ref 0.0–1.2)
CALCIUM: 9.7 mg/dL (ref 8.6–10.2)
CO2: 24 mmol/L (ref 18–29)
Chloride: 104 mmol/L (ref 96–106)
Creatinine, Ser: 1.07 mg/dL (ref 0.76–1.27)
GFR, EST AFRICAN AMERICAN: 78 mL/min/{1.73_m2} (ref >59)
GFR, EST NON AFRICAN AMERICAN: 68 mL/min/{1.73_m2} (ref >59)
GLOBULIN, TOTAL: 2.3 g/dL (ref 1.5–4.5)
Glucose: 96 mg/dL (ref 65–99)
POTASSIUM: 5.1 mmol/L (ref 3.5–5.2)
SODIUM: 145 mmol/L — AB (ref 134–144)
TOTAL PROTEIN: 6.7 g/dL (ref 6.0–8.5)

## 2017-03-26 LAB — CBC
HEMATOCRIT: 39.2 % (ref 37.5–51.0)
HEMOGLOBIN: 13.3 g/dL (ref 13.0–17.7)
MCH: 28.9 pg (ref 26.6–33.0)
MCHC: 33.9 g/dL (ref 31.5–35.7)
MCV: 85 fL (ref 79–97)
Platelets: 207 10*3/uL (ref 150–379)
RBC: 4.61 x10E6/uL (ref 4.14–5.80)
RDW: 14.3 % (ref 12.3–15.4)
WBC: 7.9 10*3/uL (ref 3.4–10.8)

## 2017-03-26 LAB — C-REACTIVE PROTEIN: CRP: 4.9 mg/L (ref 0.0–4.9)

## 2017-03-26 LAB — SEDIMENTATION RATE: Sed Rate: 11 mm/hr (ref 0–30)

## 2017-03-26 LAB — TSH: TSH: 1.76 u[IU]/mL (ref 0.450–4.500)

## 2017-03-26 NOTE — Telephone Encounter (Signed)
PA for Indomethacin approved by [employer] [id number]) through 03/25/18.  BZ#[id number]. Pt KP#[id number].

## 2017-03-31 DIAGNOSIS — L570 Actinic keratosis: Secondary | ICD-10-CM | POA: Diagnosis not present

## 2017-03-31 DIAGNOSIS — Z08 Encounter for follow-up examination after completed treatment for malignant neoplasm: Secondary | ICD-10-CM | POA: Diagnosis not present

## 2017-03-31 DIAGNOSIS — L821 Other seborrheic keratosis: Secondary | ICD-10-CM | POA: Diagnosis not present

## 2017-03-31 DIAGNOSIS — Z85828 Personal history of other malignant neoplasm of skin: Secondary | ICD-10-CM | POA: Diagnosis not present

## 2017-03-31 DIAGNOSIS — C44319 Basal cell carcinoma of skin of other parts of face: Secondary | ICD-10-CM | POA: Diagnosis not present

## 2017-04-07 ENCOUNTER — Ambulatory Visit: Payer: Medicare Other | Admitting: Neurology

## 2017-04-10 ENCOUNTER — Ambulatory Visit: Payer: Medicare Other | Admitting: Vascular Surgery

## 2017-04-10 ENCOUNTER — Encounter (HOSPITAL_COMMUNITY): Payer: Medicare Other

## 2017-04-10 DIAGNOSIS — N4340 Spermatocele of epididymis, unspecified: Secondary | ICD-10-CM | POA: Diagnosis not present

## 2017-04-10 DIAGNOSIS — N528 Other male erectile dysfunction: Secondary | ICD-10-CM | POA: Diagnosis not present

## 2017-04-10 DIAGNOSIS — C61 Malignant neoplasm of prostate: Secondary | ICD-10-CM | POA: Diagnosis not present

## 2017-04-17 ENCOUNTER — Encounter (HOSPITAL_COMMUNITY): Payer: Medicare Other

## 2017-04-17 ENCOUNTER — Ambulatory Visit: Payer: Medicare Other | Admitting: Vascular Surgery

## 2017-04-23 ENCOUNTER — Encounter: Payer: Self-pay | Admitting: Neurology

## 2017-04-23 ENCOUNTER — Ambulatory Visit: Payer: Medicare Other | Admitting: Neurology

## 2017-04-23 ENCOUNTER — Ambulatory Visit (INDEPENDENT_AMBULATORY_CARE_PROVIDER_SITE_OTHER): Payer: Medicare Other | Admitting: Neurology

## 2017-04-23 VITALS — BP 133/85 | HR 72 | Ht 73.0 in | Wt 186.0 lb

## 2017-04-23 DIAGNOSIS — I745 Embolism and thrombosis of iliac artery: Secondary | ICD-10-CM

## 2017-04-23 DIAGNOSIS — D329 Benign neoplasm of meninges, unspecified: Secondary | ICD-10-CM | POA: Diagnosis not present

## 2017-04-23 DIAGNOSIS — R0602 Shortness of breath: Secondary | ICD-10-CM | POA: Diagnosis not present

## 2017-04-23 DIAGNOSIS — R519 Headache, unspecified: Secondary | ICD-10-CM

## 2017-04-23 DIAGNOSIS — R51 Headache: Secondary | ICD-10-CM

## 2017-04-23 DIAGNOSIS — I63113 Cerebral infarction due to embolism of bilateral vertebral arteries: Secondary | ICD-10-CM | POA: Diagnosis not present

## 2017-04-23 MED ORDER — GABAPENTIN 100 MG PO CAPS: 100.0000 mg | ORAL_CAPSULE | Freq: Every day | ORAL | 6 refills | Status: DC

## 2017-04-23 NOTE — Progress Notes (Signed)
PATIENT: Gary Choi DOB: August 23, 1941  Chief Complaint  Patient presents with  . Meningioma/Headaches    Reports still having a constant, mild headache.  He stopped taking Cardizem and Indocin due to nausea. Says he has noticed an increase in fatigue recently.     HISTORICAL  Gary Choi is a 76 years old right-handed male, seen in refer by his primary care physician Dr. Harlan Stains for evaluation of headache, initial evaluation was on March 02 2017.  I reviewed and summarized the referring note, he had a history of previous heavy smoke one pack a day, quit in 2015, he had a history of metastatic cancer to liver and pancreas in 2000, was treated with surgical resection and chemotherapy. This was found during a routine colonoscopy at that time, but per record, initial original site was unknown: Biopsy consistent with tubular adenomatous polyps, hypertension, peripheral vascular disease of right leg, status post right iliac artery stent placement,History of prostate cancer, had radiation seed placement in 2005, hyperlipidemia, early COPD,  He reported a history of occasional headache in the past, in February 2018, he noticed gradually building up of persistent mild to moderate pressure headache, involving different part, no significant light noise sensitivity, no nausea, no throbbing pain. He was able to continue his daily activities such as attending his garden, playing golf without much difficulty.  He was evaluated by Dr. Dema Severin on February 18 2017, normal ESR 4, C-reactive 1.6, normal CMP with exception of mild elevated glucose 126, CBC, with hemoglobin of 15.  We also personally reviewed CT head with and without contrast in March, left frontal falx hemangioma 11 times 15 times time 19 mm.  He was treated with first round of prednisone 50 mg from March 14 to 19. Had a significant improvement of his headache, but began to have recurrent headaches again, he was started on second round of  prednisone 50 mg daily since March 2018 for 5 days,  Few hours after he started prednisone, his headache has much improved. He denies visual loss, no jaw claudication, does complains of mild increased fatigue, no weakness.   Update March 25 2017,  We have personally reviewed MRI of the brain with and without contrast on March 17 2017, left parasagittal meningioma 2x1.5x2 cm meningioma, mild periventricular gliosis.  Small left posterior temporal/occipital chronic cerebral microhemorrhage.  He still have persistent mild to moderate left frontal area pressure headache, has been taking Tylenol 500 mg 3 tablets daily for headache control, he also has watering eyes, left worse than right.  UPDATE Apr 23 2017: He was not able to tolerate indomethacin pain, causing upset stomach, nausea, it did help his headaches sound, he has stopped taking medicine in past 1 week, but continue has mild nausea, he also stopped Cardizem, which did not help his headache,  He continue take Tylenol 500 mg 2 tablets daily to control his headache, he complains of loud snoring, excessive daytime fatigue, he also woke up with headache, worse time is too early morning when he wake up from sleep.  We have personally reviewed laboratory evaluation showed normal TSH, CBC, ESR C-reactive protein,                 REVIEW OF SYSTEMS: Full 14 system review of systems performed and notable only for headache  ALLERGIES: No Known Allergies  HOME MEDICATIONS: Current Outpatient Prescriptions  Medication Sig Dispense Refill  . atorvastatin (LIPITOR) 20 MG tablet Take 20 mg by mouth daily.    Marland Kitchen  diltiazem (CARDIZEM CD) 120 MG 24 hr capsule Take 1 capsule (120 mg total) by mouth daily. 30 capsule 11  . indomethacin (INDOCIN) 50 MG capsule Take 1 capsule (50 mg total) by mouth 2 (two) times daily with a meal. 60 capsule 6  . zolpidem (AMBIEN CR) 12.5 MG CR tablet Take 12.5 mg by mouth at bedtime as needed for sleep.     No current  facility-administered medications for this visit.     PAST MEDICAL HISTORY: Past Medical History:  Diagnosis Date  . Cancer [facility])    prostate  . Colon cancer ([place])   . Headache   . Hyperlipidemia   . Meningioma ([place])     PAST SURGICAL HISTORY: Past Surgical History:  Procedure Laterality Date  . COLON RESECTION  2000  . EYE SURGERY Bilateral    cataract  . INSERTION PROSTATE RADIATION SEED  2005  . LIVER RESECTION  2000  . PERIPHERAL VASCULAR CATHETERIZATION Right 06/19/2016   Procedure: Peripheral Vascular Intervention;  Surgeon: Conrad [place], MD;  Location: [place] [facility];  Service: Cardiovascular;  Laterality: Right;  common iliac  . PERIPHERAL VASCULAR CATHETERIZATION N/A 06/19/2016   Procedure: Abdominal Aortogram;  Surgeon: Conrad Bland, MD;  Location: [place] [facility];  Service: Cardiovascular;  Laterality: N/A;    FAMILY HISTORY: Family History  Problem Relation Age of Onset  . Liver cancer Mother   . Colon cancer Mother   . Cancer Father        unsure of type    SOCIAL HISTORY:  Social History   Social History  . Marital status: Single    Spouse name: N/A  . Number of children: 2  . Years of education: some college   Occupational History  . Retired    Social History Main Topics  . Smoking status: Former Smoker    Packs/day: 1.00    Years: 45.00    Types: Cigarettes    Quit date: 06/07/2011  . Smokeless tobacco: Never Used  . Alcohol use 0.0 oz/week     Comment: Social  . Drug use: No  . Sexual activity: Not on file   Other Topics Concern  . Not on file   Social History Narrative   Tobacco use cigarettes: Former smoker ,quit in year, 2000 , Pack year Hx 62 - Tobacco history 07/28/2014. No smoking quit 2000 Alcohol : yes , Rare beer No Recreational drug use Exercise active Occupation retired from [employer]. 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   2 cups caffeine daily.     PHYSICAL EXAM   Vitals:   04/23/17 1501  BP: 133/85  Pulse: 72  Weight: 186 lb (84.4 kg)  Height: '6\' 1"'  (1.854 m)    Not recorded      Body mass index is 24.54 kg/m.  PHYSICAL EXAMNIATION:  Gen: NAD, conversant, well nourised, obese, well groomed                     Cardiovascular: Regular rate rhythm, no peripheral edema, warm, nontender. Eyes: Conjunctivae clear without exudates or hemorrhage Neck: Supple, no carotid bruits. Pulmonary: Clear to auscultation bilaterally   NEUROLOGICAL EXAM:  MENTAL STATUS: Speech:    Speech is normal; fluent and spontaneous with normal comprehension.  Cognition:     Orientation to time, place and person     Normal  recent and remote memory     Normal Attention span and concentration     Normal Language, naming, repeating,spontaneous speech     Fund of knowledge   CRANIAL NERVES: CN II: Visual fields are full to confrontation. Fundoscopic exam is normal with sharp discs and no vascular changes. Pupils are round equal and briskly reactive to light. CN III, IV, VI: extraocular movement are normal. No ptosis. CN V: Facial sensation is intact to pinprick in all 3 divisions bilaterally. Corneal responses are intact.  CN VII: Face is symmetric with normal eye closure and smile. CN VIII: Hearing is normal to rubbing fingers CN IX, X: Palate elevates symmetrically. Phonation is normal. CN XI: Head turning and shoulder shrug are intact CN XII: Tongue is midline with normal movements and no atrophy.  MOTOR: There is no pronator drift of out-stretched arms. Muscle bulk and tone are normal. Muscle strength is normal.  REFLEXES: Reflexes are 2+ and symmetric at the biceps, triceps, knees, and ankles. Plantar responses are flexor.  SENSORY: Intact to light touch, pinprick, positional sensation and vibratory sensation are intact in fingers and toes.  COORDINATION: Rapid alternating movements and  fine finger movements are intact. There is no dysmetria on finger-to-nose and heel-knee-shin.    GAIT/STANCE: Posture is normal. Gait is steady with normal steps, base, arm swing, and turning. Heel and toe walking are normal. Tandem gait is normal.  Romberg is absent.   DIAGNOSTIC DATA (LABS, IMAGING, TESTING) - I reviewed patient records, labs, notes, testing and imaging myself where available.   ASSESSMENT AND PLAN  Gary Choi is a 76 y.o. male   New onset persistent headache, with history of metastatic cancer to liver, pancreas of unknown origin  MRI of the brain with and without contrast: Showed evidence of small meningioma, no evidence of acute lesion   We will try gabapentin 100 mg at bedtime may titrating to 300 mg every night as headache prevention  Loud snoring, excessive daytime fatigue, weakness,  Will refer him to sleep study, to rule out obstructive sleep apnea  He has significant peripheral vascular disease,  Need to rule out carotid artery stenosis proceed with ultrasound of carotid artery, echocardiogram    Marcial Pacas, M.D. Ph.D.  [facility] [address] [place], [place] [address] Ph: [phone] Fax: [phone]  CC: Harlan Stains, MD

## 2017-04-27 DIAGNOSIS — Z961 Presence of intraocular lens: Secondary | ICD-10-CM | POA: Diagnosis not present

## 2017-04-27 DIAGNOSIS — H20012 Primary iridocyclitis, left eye: Secondary | ICD-10-CM | POA: Diagnosis not present

## 2017-04-29 ENCOUNTER — Telehealth: Payer: Self-pay | Admitting: Neurology

## 2017-04-29 NOTE — Telephone Encounter (Signed)
04-29-17 Spoke with Gary Choi to schedule his echo that was ordered by Dr. Krista Blue.   Patient refused to schedule test.  He stated that he need to discuss this with you.

## 2017-05-05 ENCOUNTER — Ambulatory Visit: Payer: Medicare Other | Admitting: Neurology

## 2017-05-05 DIAGNOSIS — Z961 Presence of intraocular lens: Secondary | ICD-10-CM | POA: Diagnosis not present

## 2017-05-05 DIAGNOSIS — H20012 Primary iridocyclitis, left eye: Secondary | ICD-10-CM | POA: Diagnosis not present

## 2017-05-11 ENCOUNTER — Telehealth: Payer: Self-pay | Admitting: Neurology

## 2017-05-11 NOTE — Telephone Encounter (Signed)
I left message to discuss with him about the tests.  He does not want to do ECHO, it was cancelled.   Please check with him, if he continue to complain of significant fatigue weight loss may consider contact his primary care for further evaluations  He has follow up with me on June 5th

## 2017-05-11 NOTE — Telephone Encounter (Signed)
Called patient and left him a message relaying doppler had been CX at the hospital and patient will have to have at [facility] June 14 th at 1:30 arrive at 1:15 . Telephone [phone]. I have called and left a message asking patient to call me back.

## 2017-05-12 ENCOUNTER — Encounter: Payer: Self-pay | Admitting: Neurology

## 2017-05-12 ENCOUNTER — Ambulatory Visit (INDEPENDENT_AMBULATORY_CARE_PROVIDER_SITE_OTHER): Payer: Medicare Other | Admitting: Neurology

## 2017-05-12 VITALS — BP 148/75 | HR 63 | Ht 73.0 in | Wt 186.0 lb

## 2017-05-12 DIAGNOSIS — R51 Headache: Secondary | ICD-10-CM

## 2017-05-12 DIAGNOSIS — R519 Headache, unspecified: Secondary | ICD-10-CM

## 2017-05-12 DIAGNOSIS — D329 Benign neoplasm of meninges, unspecified: Secondary | ICD-10-CM

## 2017-05-12 DIAGNOSIS — I745 Embolism and thrombosis of iliac artery: Secondary | ICD-10-CM | POA: Diagnosis not present

## 2017-05-12 NOTE — Progress Notes (Signed)
PATIENT: Gary Choi DOB: 08-11-1941  Chief Complaint  Patient presents with  . Headaches    He is being treated for Iritis by Dr. Sharol Roussel.  He is currently using Prednisone eye drops.  States his headaches have resolved with this treatment for his eye condition.  He is no longer using gabapentin.  . Peripheral Vascular Disease    He declined his appointment for ECHO.  . Sleep Issues    He is able to fall asleep easily but still wakes up often during the night.  He does not wish to proceed with the ordered sleep study at this time.     HISTORICAL  Correll Denbow is a 76 years old right-handed male, seen in refer by his primary care physician Dr. Harlan Stains for evaluation of headache, initial evaluation was on March 02 2017.  I reviewed and summarized the referring note, he had a history of previous heavy smoke one pack a day, quit in 2015, he had a history of metastatic cancer to liver and pancreas in 2000, was treated with surgical resection and chemotherapy. This was found during a routine colonoscopy at that time, but per record, initial original site was unknown: Biopsy consistent with tubular adenomatous polyps, hypertension, peripheral vascular disease of right leg, status post right iliac artery stent placement,History of prostate cancer, had radiation seed placement in 2005, hyperlipidemia, early COPD,  He reported a history of occasional headache in the past, in February 2018, he noticed gradually building up of persistent mild to moderate pressure headache, involving different part, no significant light noise sensitivity, no nausea, no throbbing pain. He was able to continue his daily activities such as attending his garden, playing golf without much difficulty.  He was evaluated by Dr. Dema Severin on February 18 2017, normal ESR 4, C-reactive 1.6, normal CMP with exception of mild elevated glucose 126, CBC, with hemoglobin of 15.  We also personally reviewed CT head with and  without contrast in March, left frontal falx hemangioma 11 times 15 times time 19 mm.  He was treated with first round of prednisone 50 mg from March 14 to 19. Had a significant improvement of his headache, but began to have recurrent headaches again, he was started on second round of prednisone 50 mg daily since March 2018 for 5 days,  Few hours after he started prednisone, his headache has much improved. He denies visual loss, no jaw claudication, does complains of mild increased fatigue, no weakness.   Update March 25 2017,  We have personally reviewed MRI of the brain with and without contrast on March 17 2017, left parasagittal meningioma 2x1.5x2 cm meningioma, mild periventricular gliosis.  Small left posterior temporal/occipital chronic cerebral microhemorrhage.  He still have persistent mild to moderate left frontal area pressure headache, has been taking Tylenol 500 mg 3 tablets daily for headache control, he also has watering eyes, left worse than right.  UPDATE Apr 23 2017: He was not able to tolerate indomethacin pain, causing upset stomach, nausea, it did help his headaches sound, he has stopped taking medicine in past 1 week, but continue has mild nausea, he also stopped Cardizem, which did not help his headache,  He continue take Tylenol 500 mg 2 tablets daily to control his headache, he complains of loud snoring, excessive daytime fatigue, he also woke up with headache, worse time is too early morning when he wake up from sleep.  We have personally reviewed laboratory evaluation showed normal TSH, CBC, ESR C-reactive protein,  UPDATE May 12 2017: His left eye turned to red at the end of May 2018, he was diagnosed with iritis, received prednisone eye job, his headache and fatigue has much improved, he wants to hold off further evaluation,                REVIEW OF SYSTEMS: Full 14 system review of systems performed and notable only for headache  ALLERGIES: No Known  Allergies  HOME MEDICATIONS: Current Outpatient Prescriptions  Medication Sig Dispense Refill  . atorvastatin (LIPITOR) 20 MG tablet Take 20 mg by mouth daily.    . prednisoLONE acetate (PRED FORTE) 1 % ophthalmic suspension PLACE 1 DROP IN SURGICAL EYE FOUR TIMES A DAY AFTER SURGERY  1  . zolpidem (AMBIEN CR) 12.5 MG CR tablet Take 12.5 mg by mouth at bedtime as needed for sleep.     No current facility-administered medications for this visit.     PAST MEDICAL HISTORY: Past Medical History:  Diagnosis Date  . Cancer [facility])    prostate  . Colon cancer ([place])   . Headache   . Hyperlipidemia   . Meningioma ([place])     PAST SURGICAL HISTORY: Past Surgical History:  Procedure Laterality Date  . COLON RESECTION  2000  . EYE SURGERY Bilateral    cataract  . INSERTION PROSTATE RADIATION SEED  2005  . LIVER RESECTION  2000  . PERIPHERAL VASCULAR CATHETERIZATION Right 06/19/2016   Procedure: Peripheral Vascular Intervention;  Surgeon: Conrad [place], MD;  Location: [place] [facility];  Service: Cardiovascular;  Laterality: Right;  common iliac  . PERIPHERAL VASCULAR CATHETERIZATION N/A 06/19/2016   Procedure: Abdominal Aortogram;  Surgeon: Conrad Dahlgren, MD;  Location: [place] [facility];  Service: Cardiovascular;  Laterality: N/A;    FAMILY HISTORY: Family History  Problem Relation Age of Onset  . Liver cancer Mother   . Colon cancer Mother   . Cancer Father        unsure of type    SOCIAL HISTORY:  Social History   Social History  . Marital status: Single    Spouse name: N/A  . Number of children: 2  . Years of education: some college   Occupational History  . Retired    Social History Main Topics  . Smoking status: Former Smoker    Packs/day: 1.00    Years: 45.00    Types: Cigarettes    Quit date: 06/07/2011  . Smokeless tobacco: Never Used  . Alcohol use 0.0 oz/week     Comment: Social  . Drug use: No  . Sexual activity: Not on file   Other Topics Concern  .  Not on file   Social History Narrative   Tobacco use cigarettes: Former smoker ,quit in year, 2000 , Pack year Hx 60 - Tobacco history 07/28/2014. No smoking quit 2000 Alcohol : yes , Rare beer No Recreational drug use Exercise active Occupation retired from [employer]. 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   2 cups caffeine daily.     PHYSICAL EXAM   Vitals:   05/12/17 0803  BP: (!) 148/75  Pulse: 63  Weight: 186 lb (84.4 kg)  Height: _0  (1.854 m)    Not recorded      Body mass index is 24.54 kg/m.  PHYSICAL EXAMNIATION:  Gen: NAD, conversant, well nourised, obese, well groomed  Cardiovascular: Regular rate rhythm, no peripheral edema, warm, nontender. Eyes: Conjunctivae clear without exudates or hemorrhage Neck: Supple, no carotid bruits. Pulmonary: Clear to auscultation bilaterally   NEUROLOGICAL EXAM:  MENTAL STATUS: Speech:    Speech is normal; fluent and spontaneous with normal comprehension.  Cognition:     Orientation to time, place and person     Normal recent and remote memory     Normal Attention span and concentration     Normal Language, naming, repeating,spontaneous speech     Fund of knowledge   CRANIAL NERVES: CN II: Visual fields are full to confrontation. Fundoscopic exam is normal with sharp discs and no vascular changes. Pupils are round equal and briskly reactive to light. CN III, IV, VI: extraocular movement are normal. No ptosis. CN V: Facial sensation is intact to pinprick in all 3 divisions bilaterally. Corneal responses are intact.  CN VII: Face is symmetric with normal eye closure and smile. CN VIII: Hearing is normal to rubbing fingers CN IX, X: Palate elevates symmetrically. Phonation is normal. CN XI: Head turning and shoulder shrug are intact CN XII: Tongue is midline with normal movements and no  atrophy.  MOTOR: There is no pronator drift of out-stretched arms. Muscle bulk and tone are normal. Muscle strength is normal.  REFLEXES: Reflexes are 2+ and symmetric at the biceps, triceps, knees, and ankles. Plantar responses are flexor.  SENSORY: Intact to light touch, pinprick, positional sensation and vibratory sensation are intact in fingers and toes.  COORDINATION: Rapid alternating movements and fine finger movements are intact. There is no dysmetria on finger-to-nose and heel-knee-shin.    GAIT/STANCE: Posture is normal. Gait is steady with normal steps, base, arm swing, and turning. Heel and toe walking are normal. Tandem gait is normal.  Romberg is absent.   DIAGNOSTIC DATA (LABS, IMAGING, TESTING) - I reviewed patient records, labs, notes, testing and imaging myself where available.   ASSESSMENT AND PLAN  Jasmond River is a 75 y.o. male   New onset persistent headache, with history of metastatic cancer to liver, pancreas of unknown origin  MRI of the brain with and without contrast: Showed evidence of small meningioma, no evidence of acute lesion   He was diagnosed with left-sided iritis, his headache has improved with eye drop    Marcial Pacas, M.D. Ph.D.  [facility] [address] [place], [place] [address] Ph: [phone] Fax: [phone]  CC: Harlan Stains, MD

## 2017-05-13 ENCOUNTER — Ambulatory Visit (HOSPITAL_COMMUNITY): Admission: RE | Admit: 2017-05-13 | Payer: Medicare Other | Source: Ambulatory Visit

## 2017-05-15 ENCOUNTER — Other Ambulatory Visit: Payer: Self-pay | Admitting: Neurology

## 2017-05-15 DIAGNOSIS — R51 Headache: Secondary | ICD-10-CM

## 2017-05-15 DIAGNOSIS — D329 Benign neoplasm of meninges, unspecified: Secondary | ICD-10-CM

## 2017-05-15 DIAGNOSIS — R519 Headache, unspecified: Secondary | ICD-10-CM

## 2017-05-21 ENCOUNTER — Ambulatory Visit (HOSPITAL_COMMUNITY)
Admission: RE | Admit: 2017-05-21 | Payer: Medicare Other | Source: Ambulatory Visit | Attending: Neurology | Admitting: Neurology

## 2017-06-22 ENCOUNTER — Ambulatory Visit: Payer: Medicare Other | Admitting: Neurology

## 2017-07-24 DIAGNOSIS — H209 Unspecified iridocyclitis: Secondary | ICD-10-CM | POA: Diagnosis not present

## 2017-07-24 DIAGNOSIS — Z961 Presence of intraocular lens: Secondary | ICD-10-CM | POA: Diagnosis not present

## 2017-09-04 DIAGNOSIS — H209 Unspecified iridocyclitis: Secondary | ICD-10-CM | POA: Diagnosis not present

## 2017-09-04 DIAGNOSIS — Z961 Presence of intraocular lens: Secondary | ICD-10-CM | POA: Diagnosis not present

## 2017-09-04 DIAGNOSIS — H04123 Dry eye syndrome of bilateral lacrimal glands: Secondary | ICD-10-CM | POA: Diagnosis not present

## 2017-09-29 DIAGNOSIS — L570 Actinic keratosis: Secondary | ICD-10-CM | POA: Diagnosis not present

## 2017-09-29 DIAGNOSIS — Z85828 Personal history of other malignant neoplasm of skin: Secondary | ICD-10-CM | POA: Diagnosis not present

## 2017-09-29 DIAGNOSIS — Z08 Encounter for follow-up examination after completed treatment for malignant neoplasm: Secondary | ICD-10-CM | POA: Diagnosis not present

## 2017-10-09 DIAGNOSIS — R252 Cramp and spasm: Secondary | ICD-10-CM | POA: Diagnosis not present

## 2017-10-09 DIAGNOSIS — Z23 Encounter for immunization: Secondary | ICD-10-CM | POA: Diagnosis not present

## 2017-10-09 DIAGNOSIS — F5101 Primary insomnia: Secondary | ICD-10-CM | POA: Diagnosis not present

## 2017-10-09 DIAGNOSIS — E785 Hyperlipidemia, unspecified: Secondary | ICD-10-CM | POA: Diagnosis not present

## 2017-10-16 DIAGNOSIS — Z8546 Personal history of malignant neoplasm of prostate: Secondary | ICD-10-CM | POA: Diagnosis not present

## 2017-10-16 DIAGNOSIS — C61 Malignant neoplasm of prostate: Secondary | ICD-10-CM | POA: Diagnosis not present

## 2017-10-16 DIAGNOSIS — N528 Other male erectile dysfunction: Secondary | ICD-10-CM | POA: Diagnosis not present

## 2017-10-16 DIAGNOSIS — N320 Bladder-neck obstruction: Secondary | ICD-10-CM | POA: Diagnosis not present

## 2018-01-01 ENCOUNTER — Other Ambulatory Visit: Payer: Self-pay | Admitting: Family Medicine

## 2018-01-01 ENCOUNTER — Ambulatory Visit
Admission: RE | Admit: 2018-01-01 | Discharge: 2018-01-01 | Disposition: A | Discharge status: Home / Self Care | Payer: Medicare Other | Source: Ambulatory Visit | Attending: Family Medicine | Admitting: Family Medicine

## 2018-01-01 DIAGNOSIS — K30 Functional dyspepsia: Secondary | ICD-10-CM | POA: Diagnosis not present

## 2018-01-01 DIAGNOSIS — R0781 Pleurodynia: Secondary | ICD-10-CM

## 2018-01-05 DIAGNOSIS — R0781 Pleurodynia: Secondary | ICD-10-CM | POA: Diagnosis not present

## 2018-01-07 ENCOUNTER — Other Ambulatory Visit: Payer: Self-pay | Admitting: Family Medicine

## 2018-01-07 DIAGNOSIS — R0781 Pleurodynia: Secondary | ICD-10-CM

## 2018-01-08 ENCOUNTER — Ambulatory Visit
Admission: RE | Admit: 2018-01-08 | Discharge: 2018-01-08 | Disposition: A | Discharge status: Home / Self Care | Payer: Medicare Other | Source: Ambulatory Visit | Attending: Family Medicine | Admitting: Family Medicine

## 2018-01-08 DIAGNOSIS — J439 Emphysema, unspecified: Secondary | ICD-10-CM | POA: Diagnosis not present

## 2018-01-08 DIAGNOSIS — R0781 Pleurodynia: Secondary | ICD-10-CM

## 2018-01-08 MED ORDER — IOPAMIDOL (ISOVUE-300) INJECTION 61%
75.0000 mL | Freq: Once | INTRAVENOUS | Status: AC | PRN
  Administered 2018-01-08: 75 mL via INTRAVENOUS

## 2018-01-26 DIAGNOSIS — H04123 Dry eye syndrome of bilateral lacrimal glands: Secondary | ICD-10-CM | POA: Diagnosis not present

## 2018-01-26 DIAGNOSIS — Z961 Presence of intraocular lens: Secondary | ICD-10-CM | POA: Diagnosis not present

## 2018-01-26 DIAGNOSIS — H209 Unspecified iridocyclitis: Secondary | ICD-10-CM | POA: Diagnosis not present

## 2018-02-09 DIAGNOSIS — H35352 Cystoid macular degeneration, left eye: Secondary | ICD-10-CM | POA: Diagnosis not present

## 2018-02-09 DIAGNOSIS — H04123 Dry eye syndrome of bilateral lacrimal glands: Secondary | ICD-10-CM | POA: Diagnosis not present

## 2018-02-09 DIAGNOSIS — H209 Unspecified iridocyclitis: Secondary | ICD-10-CM | POA: Diagnosis not present

## 2018-02-09 DIAGNOSIS — H35372 Puckering of macula, left eye: Secondary | ICD-10-CM | POA: Diagnosis not present

## 2018-02-09 DIAGNOSIS — Z961 Presence of intraocular lens: Secondary | ICD-10-CM | POA: Diagnosis not present

## 2018-02-19 DIAGNOSIS — Z961 Presence of intraocular lens: Secondary | ICD-10-CM | POA: Diagnosis not present

## 2018-02-19 DIAGNOSIS — H35372 Puckering of macula, left eye: Secondary | ICD-10-CM | POA: Diagnosis not present

## 2018-02-19 DIAGNOSIS — H3581 Retinal edema: Secondary | ICD-10-CM | POA: Diagnosis not present

## 2018-02-19 DIAGNOSIS — H30032 Focal chorioretinal inflammation, peripheral, left eye: Secondary | ICD-10-CM | POA: Diagnosis not present

## 2018-03-15 DIAGNOSIS — H3581 Retinal edema: Secondary | ICD-10-CM | POA: Diagnosis not present

## 2018-03-15 DIAGNOSIS — Z961 Presence of intraocular lens: Secondary | ICD-10-CM | POA: Diagnosis not present

## 2018-03-15 DIAGNOSIS — H30032 Focal chorioretinal inflammation, peripheral, left eye: Secondary | ICD-10-CM | POA: Diagnosis not present

## 2018-03-15 DIAGNOSIS — H35372 Puckering of macula, left eye: Secondary | ICD-10-CM | POA: Diagnosis not present

## 2018-09-15 DIAGNOSIS — Z85038 Personal history of other malignant neoplasm of large intestine: Secondary | ICD-10-CM | POA: Diagnosis not present

## 2018-09-15 DIAGNOSIS — I739 Peripheral vascular disease, unspecified: Secondary | ICD-10-CM | POA: Diagnosis not present

## 2018-09-15 DIAGNOSIS — Z8546 Personal history of malignant neoplasm of prostate: Secondary | ICD-10-CM | POA: Diagnosis not present

## 2018-10-12 DIAGNOSIS — D126 Benign neoplasm of colon, unspecified: Secondary | ICD-10-CM | POA: Diagnosis not present

## 2018-10-12 DIAGNOSIS — D128 Benign neoplasm of rectum: Secondary | ICD-10-CM | POA: Diagnosis not present

## 2018-10-12 DIAGNOSIS — Z85038 Personal history of other malignant neoplasm of large intestine: Secondary | ICD-10-CM | POA: Diagnosis not present

## 2018-10-12 DIAGNOSIS — K5730 Diverticulosis of large intestine without perforation or abscess without bleeding: Secondary | ICD-10-CM | POA: Diagnosis not present

## 2018-10-19 DIAGNOSIS — D126 Benign neoplasm of colon, unspecified: Secondary | ICD-10-CM | POA: Diagnosis not present

## 2018-10-29 DIAGNOSIS — N528 Other male erectile dysfunction: Secondary | ICD-10-CM | POA: Diagnosis not present

## 2018-10-29 DIAGNOSIS — Z8546 Personal history of malignant neoplasm of prostate: Secondary | ICD-10-CM | POA: Diagnosis not present

## 2018-10-29 DIAGNOSIS — N320 Bladder-neck obstruction: Secondary | ICD-10-CM | POA: Diagnosis not present

## 2018-11-22 DIAGNOSIS — D485 Neoplasm of uncertain behavior of skin: Secondary | ICD-10-CM | POA: Diagnosis not present

## 2018-11-22 DIAGNOSIS — C44319 Basal cell carcinoma of skin of other parts of face: Secondary | ICD-10-CM | POA: Diagnosis not present

## 2018-11-22 DIAGNOSIS — L738 Other specified follicular disorders: Secondary | ICD-10-CM | POA: Diagnosis not present

## 2018-11-22 DIAGNOSIS — L821 Other seborrheic keratosis: Secondary | ICD-10-CM | POA: Diagnosis not present

## 2018-11-22 DIAGNOSIS — L570 Actinic keratosis: Secondary | ICD-10-CM | POA: Diagnosis not present

## 2018-11-22 DIAGNOSIS — Z85828 Personal history of other malignant neoplasm of skin: Secondary | ICD-10-CM | POA: Diagnosis not present

## 2019-06-02 DIAGNOSIS — F5101 Primary insomnia: Secondary | ICD-10-CM | POA: Diagnosis not present

## 2019-06-02 DIAGNOSIS — I739 Peripheral vascular disease, unspecified: Secondary | ICD-10-CM | POA: Diagnosis not present

## 2019-06-02 DIAGNOSIS — R252 Cramp and spasm: Secondary | ICD-10-CM | POA: Diagnosis not present

## 2019-06-02 DIAGNOSIS — E785 Hyperlipidemia, unspecified: Secondary | ICD-10-CM | POA: Diagnosis not present

## 2019-06-06 DIAGNOSIS — L720 Epidermal cyst: Secondary | ICD-10-CM | POA: Diagnosis not present

## 2019-06-06 DIAGNOSIS — L821 Other seborrheic keratosis: Secondary | ICD-10-CM | POA: Diagnosis not present

## 2019-06-06 DIAGNOSIS — C44719 Basal cell carcinoma of skin of left lower limb, including hip: Secondary | ICD-10-CM | POA: Diagnosis not present

## 2019-06-06 DIAGNOSIS — Z85828 Personal history of other malignant neoplasm of skin: Secondary | ICD-10-CM | POA: Diagnosis not present

## 2019-06-06 DIAGNOSIS — L570 Actinic keratosis: Secondary | ICD-10-CM | POA: Diagnosis not present

## 2019-06-06 DIAGNOSIS — D485 Neoplasm of uncertain behavior of skin: Secondary | ICD-10-CM | POA: Diagnosis not present

## 2019-08-05 ENCOUNTER — Other Ambulatory Visit: Payer: Self-pay

## 2019-08-05 DIAGNOSIS — I745 Embolism and thrombosis of iliac artery: Secondary | ICD-10-CM

## 2019-08-05 DIAGNOSIS — I739 Peripheral vascular disease, unspecified: Secondary | ICD-10-CM

## 2019-08-08 ENCOUNTER — Telehealth (HOSPITAL_COMMUNITY): Payer: Self-pay | Admitting: Rehabilitation

## 2019-08-08 NOTE — Telephone Encounter (Signed)

## 2019-08-09 ENCOUNTER — Other Ambulatory Visit: Payer: Self-pay

## 2019-08-09 ENCOUNTER — Ambulatory Visit (HOSPITAL_COMMUNITY)
Admission: RE | Admit: 2019-08-09 | Discharge: 2019-08-09 | Disposition: A | Discharge status: Home / Self Care | Payer: Medicare Other | Source: Ambulatory Visit | Attending: Vascular Surgery | Admitting: Vascular Surgery

## 2019-08-09 ENCOUNTER — Ambulatory Visit (INDEPENDENT_AMBULATORY_CARE_PROVIDER_SITE_OTHER)
Admission: RE | Admit: 2019-08-09 | Discharge: 2019-08-09 | Disposition: A | Discharge status: Home / Self Care | Payer: Medicare Other | Source: Ambulatory Visit | Attending: Vascular Surgery | Admitting: Vascular Surgery

## 2019-08-09 ENCOUNTER — Ambulatory Visit (INDEPENDENT_AMBULATORY_CARE_PROVIDER_SITE_OTHER): Payer: Medicare Other | Admitting: Vascular Surgery

## 2019-08-09 ENCOUNTER — Encounter: Payer: Self-pay | Admitting: Vascular Surgery

## 2019-08-09 VITALS — BP 148/85 | HR 60 | Temp 97.9°F | Resp 14 | Ht 73.0 in | Wt 189.3 lb

## 2019-08-09 DIAGNOSIS — I745 Embolism and thrombosis of iliac artery: Secondary | ICD-10-CM | POA: Diagnosis not present

## 2019-08-09 DIAGNOSIS — I739 Peripheral vascular disease, unspecified: Secondary | ICD-10-CM | POA: Insufficient documentation

## 2019-08-09 NOTE — Progress Notes (Signed)
Patient name: Gary Choi MRN: [id number] DOB: December 01, 1941 Sex: male  REASON FOR VISIT: Evaluate right leg after previous right common iliac stent  HPI: Gary Choi is a 78 y.o. male that presents for reevaluation of his right leg after previous right common iliac stent.  He had a subintimal angioplasty and stent of his right common iliac artery with an 8 x 35 lifestream by Dr. Bridgett Larsson in 2017.  He was last seen on 09/29/2016.  He states his leg is much better compared to what it used to be.  Recently over the last several months he noticed slight discomfort in his right leg.  States it does not bother him every day.  Really not able to quantify how far he can walk since he states depending on how much he is exerting or if he is walking on incline sometimes does not bother him at all.  He states most of the time it is pretty minor not overly troublesome but he does feel he should have it checked.  He denies any tobacco abuse.  He is playing golf.  Works in the yard.  Past Medical History:  Diagnosis Date  . Cancer [facility])    prostate  . Colon cancer ([place])   . Headache   . Hyperlipidemia   . Meningioma [facility])     Past Surgical History:  Procedure Laterality Date  . COLON RESECTION  2000  . EYE SURGERY Bilateral    cataract  . INSERTION PROSTATE RADIATION SEED  2005  . LIVER RESECTION  2000  . PERIPHERAL VASCULAR CATHETERIZATION Right 06/19/2016   Procedure: Peripheral Vascular Intervention;  Surgeon: Conrad Balta, MD;  Location: [facility];  Service: Cardiovascular;  Laterality: Right;  common iliac  . PERIPHERAL VASCULAR CATHETERIZATION N/A 06/19/2016   Procedure: Abdominal Aortogram;  Surgeon: Conrad Guayama, MD;  Location: [facility];  Service: Cardiovascular;  Laterality: N/A;    Family History  Problem Relation Age of Onset  . Liver cancer Mother   . Colon cancer Mother   . Cancer Father        unsure of type    SOCIAL HISTORY: Social History   Tobacco Use  .  Smoking status: Former Smoker    Packs/day: 1.00    Years: 45.00    Pack years: 45.00    Types: Cigarettes    Quit date: 06/07/2011    Years since quitting: 8.1  . Smokeless tobacco: Never Used  Substance Use Topics  . Alcohol use: Yes    Alcohol/week: 0.0 standard drinks    Comment: Social    No Known Allergies  Current Outpatient Medications  Medication Sig Dispense Refill  . atorvastatin (LIPITOR) 20 MG tablet Take 20 mg by mouth daily.    . traZODone (DESYREL) 100 MG tablet Take 100 mg by mouth at bedtime.    Marland Kitchen zolpidem (AMBIEN CR) 12.5 MG CR tablet Take 12.5 mg by mouth at bedtime as needed for sleep.     No current facility-administered medications for this visit.     REVIEW OF SYSTEMS:  [X]  denotes positive finding, [ ]  denotes negative finding Cardiac  Comments:  Chest pain or chest pressure:    Shortness of breath upon exertion:    Short of breath when lying flat:    Irregular heart rhythm:        Vascular    Pain in calf, thigh, or hip brought on by ambulation:    Pain in feet at night  that wakes you up from your sleep:     Blood clot in your veins:    Leg swelling:         Pulmonary    Oxygen at home:    Productive cough:     Wheezing:         Neurologic    Sudden weakness in arms or legs:     Sudden numbness in arms or legs:     Sudden onset of difficulty speaking or slurred speech:    Temporary loss of vision in one eye:     Problems with dizziness:         Gastrointestinal    Blood in stool:     Vomited blood:         Genitourinary    Burning when urinating:     Blood in urine:        Psychiatric    Major depression:         Hematologic    Bleeding problems:    Problems with blood clotting too easily:        Skin    Rashes or ulcers:        Constitutional    Fever or chills:      PHYSICAL EXAM: Vitals:   08/09/19 0915  BP: (!) 148/85  Pulse: 60  Resp: 14  Temp: 97.9 F (36.6 C)  TempSrc: Temporal  SpO2: 98%  Weight: 189  lb 4.8 oz (85.9 kg)  Height: 6\' 1"  (1.854 m)    GENERAL: The patient is a well-nourished male, in no acute distress. The vital signs are documented above. CARDIAC: There is a regular rate and rhythm.  VASCULAR:  2+ palpable femoral pulse both groins 2+ palpable PT pulse both groins No tissue loss  DATA:   ABI 0.95 on the right triphasic and 1.03 on the left triphasic  No focal velocity elevation in right common iliac stent  Assessment/Plan:  78 year old male that presents for reevaluation of his right leg in the setting of a right common iliac stent in 2017 by Dr. Bridgett Larsson for intermittent claudication.  On duplex today he really had no significant increase in the velocity in his common iliac stent since 2017.  He has normal ABI at the ankle with a briskly palpable femoral and posterior tibial pulse in the right leg.  In discussing in detail the symptoms it sounds like these are very minor and often intermittent.  We agreed to just monitor this for now and I will see him back in 6 months with another set of ABIs and aortoiliac duplex.  I did discuss that he contact our office if he feels his symptoms worsening in the interim and we will just arrange aortogram and get a better picture and ensure there is nothing else going on like in-stent stenosis or new lesion that would require intervention.   Marty Heck, MD [facility] Office: [phone] Pager: [phone]

## 2019-12-20 DIAGNOSIS — C44612 Basal cell carcinoma of skin of right upper limb, including shoulder: Secondary | ICD-10-CM | POA: Diagnosis not present

## 2019-12-20 DIAGNOSIS — D485 Neoplasm of uncertain behavior of skin: Secondary | ICD-10-CM | POA: Diagnosis not present

## 2019-12-20 DIAGNOSIS — C44619 Basal cell carcinoma of skin of left upper limb, including shoulder: Secondary | ICD-10-CM | POA: Diagnosis not present

## 2020-01-04 DIAGNOSIS — D485 Neoplasm of uncertain behavior of skin: Secondary | ICD-10-CM | POA: Diagnosis not present

## 2020-01-04 DIAGNOSIS — L905 Scar conditions and fibrosis of skin: Secondary | ICD-10-CM | POA: Diagnosis not present

## 2020-01-04 DIAGNOSIS — C4359 Malignant melanoma of other part of trunk: Secondary | ICD-10-CM | POA: Diagnosis not present

## 2020-01-11 DIAGNOSIS — C44619 Basal cell carcinoma of skin of left upper limb, including shoulder: Secondary | ICD-10-CM | POA: Diagnosis not present

## 2020-01-11 DIAGNOSIS — C44612 Basal cell carcinoma of skin of right upper limb, including shoulder: Secondary | ICD-10-CM | POA: Diagnosis not present

## 2020-01-11 DIAGNOSIS — C4359 Malignant melanoma of other part of trunk: Secondary | ICD-10-CM | POA: Diagnosis not present

## 2020-01-23 DIAGNOSIS — C4359 Malignant melanoma of other part of trunk: Secondary | ICD-10-CM | POA: Diagnosis not present

## 2020-01-24 DIAGNOSIS — E785 Hyperlipidemia, unspecified: Secondary | ICD-10-CM | POA: Diagnosis not present

## 2020-01-24 DIAGNOSIS — Z87891 Personal history of nicotine dependence: Secondary | ICD-10-CM | POA: Diagnosis not present

## 2020-01-24 DIAGNOSIS — R897 Abnormal histological findings in specimens from other organs, systems and tissues: Secondary | ICD-10-CM | POA: Diagnosis not present

## 2020-01-24 DIAGNOSIS — Z20822 Contact with and (suspected) exposure to covid-19: Secondary | ICD-10-CM | POA: Diagnosis not present

## 2020-01-24 DIAGNOSIS — Z8546 Personal history of malignant neoplasm of prostate: Secondary | ICD-10-CM | POA: Diagnosis not present

## 2020-01-24 DIAGNOSIS — C4359 Malignant melanoma of other part of trunk: Secondary | ICD-10-CM | POA: Diagnosis not present

## 2020-02-02 DIAGNOSIS — Z85038 Personal history of other malignant neoplasm of large intestine: Secondary | ICD-10-CM | POA: Diagnosis not present

## 2020-02-02 DIAGNOSIS — F5101 Primary insomnia: Secondary | ICD-10-CM | POA: Diagnosis not present

## 2020-02-02 DIAGNOSIS — C61 Malignant neoplasm of prostate: Secondary | ICD-10-CM | POA: Diagnosis not present

## 2020-02-02 DIAGNOSIS — E782 Mixed hyperlipidemia: Secondary | ICD-10-CM | POA: Diagnosis not present

## 2020-02-02 DIAGNOSIS — C4359 Malignant melanoma of other part of trunk: Secondary | ICD-10-CM | POA: Diagnosis not present

## 2020-02-02 DIAGNOSIS — Z6824 Body mass index (BMI) 24.0-24.9, adult: Secondary | ICD-10-CM | POA: Diagnosis not present

## 2020-02-02 DIAGNOSIS — E559 Vitamin D deficiency, unspecified: Secondary | ICD-10-CM | POA: Diagnosis not present

## 2020-02-02 DIAGNOSIS — L7682 Other postprocedural complications of skin and subcutaneous tissue: Secondary | ICD-10-CM | POA: Diagnosis not present

## 2020-02-07 DIAGNOSIS — Z85820 Personal history of malignant melanoma of skin: Secondary | ICD-10-CM | POA: Diagnosis not present

## 2020-02-29 DIAGNOSIS — E559 Vitamin D deficiency, unspecified: Secondary | ICD-10-CM | POA: Diagnosis not present

## 2020-02-29 DIAGNOSIS — C61 Malignant neoplasm of prostate: Secondary | ICD-10-CM | POA: Diagnosis not present

## 2020-02-29 DIAGNOSIS — E782 Mixed hyperlipidemia: Secondary | ICD-10-CM | POA: Diagnosis not present

## 2020-03-06 DIAGNOSIS — R0600 Dyspnea, unspecified: Secondary | ICD-10-CM | POA: Diagnosis not present

## 2020-03-06 DIAGNOSIS — Z6824 Body mass index (BMI) 24.0-24.9, adult: Secondary | ICD-10-CM | POA: Diagnosis not present

## 2020-03-06 DIAGNOSIS — R252 Cramp and spasm: Secondary | ICD-10-CM | POA: Diagnosis not present

## 2020-03-06 DIAGNOSIS — C61 Malignant neoplasm of prostate: Secondary | ICD-10-CM | POA: Diagnosis not present

## 2020-03-06 DIAGNOSIS — R5383 Other fatigue: Secondary | ICD-10-CM | POA: Diagnosis not present

## 2020-04-09 DIAGNOSIS — Z87891 Personal history of nicotine dependence: Secondary | ICD-10-CM | POA: Diagnosis not present

## 2020-04-09 DIAGNOSIS — R0602 Shortness of breath: Secondary | ICD-10-CM | POA: Diagnosis not present

## 2020-04-09 DIAGNOSIS — Z6825 Body mass index (BMI) 25.0-25.9, adult: Secondary | ICD-10-CM | POA: Diagnosis not present

## 2020-04-09 DIAGNOSIS — E782 Mixed hyperlipidemia: Secondary | ICD-10-CM | POA: Diagnosis not present

## 2020-04-09 DIAGNOSIS — R030 Elevated blood-pressure reading, without diagnosis of hypertension: Secondary | ICD-10-CM | POA: Diagnosis not present

## 2020-04-09 DIAGNOSIS — R9389 Abnormal findings on diagnostic imaging of other specified body structures: Secondary | ICD-10-CM | POA: Diagnosis not present

## 2020-04-09 DIAGNOSIS — I452 Bifascicular block: Secondary | ICD-10-CM | POA: Diagnosis not present

## 2020-04-09 DIAGNOSIS — I4510 Unspecified right bundle-branch block: Secondary | ICD-10-CM | POA: Diagnosis not present

## 2020-04-20 DIAGNOSIS — R0600 Dyspnea, unspecified: Secondary | ICD-10-CM | POA: Diagnosis not present

## 2020-05-08 DIAGNOSIS — L814 Other melanin hyperpigmentation: Secondary | ICD-10-CM | POA: Diagnosis not present

## 2020-05-08 DIAGNOSIS — L570 Actinic keratosis: Secondary | ICD-10-CM | POA: Diagnosis not present

## 2020-05-08 DIAGNOSIS — D2262 Melanocytic nevi of left upper limb, including shoulder: Secondary | ICD-10-CM | POA: Diagnosis not present

## 2020-05-08 DIAGNOSIS — Z08 Encounter for follow-up examination after completed treatment for malignant neoplasm: Secondary | ICD-10-CM | POA: Diagnosis not present

## 2020-05-08 DIAGNOSIS — D2272 Melanocytic nevi of left lower limb, including hip: Secondary | ICD-10-CM | POA: Diagnosis not present

## 2020-05-08 DIAGNOSIS — D225 Melanocytic nevi of trunk: Secondary | ICD-10-CM | POA: Diagnosis not present

## 2020-05-08 DIAGNOSIS — D2271 Melanocytic nevi of right lower limb, including hip: Secondary | ICD-10-CM | POA: Diagnosis not present

## 2020-05-08 DIAGNOSIS — Z85820 Personal history of malignant melanoma of skin: Secondary | ICD-10-CM | POA: Diagnosis not present

## 2020-05-08 DIAGNOSIS — Z85828 Personal history of other malignant neoplasm of skin: Secondary | ICD-10-CM | POA: Diagnosis not present

## 2020-05-08 DIAGNOSIS — L821 Other seborrheic keratosis: Secondary | ICD-10-CM | POA: Diagnosis not present

## 2020-05-09 DIAGNOSIS — K5790 Diverticulosis of intestine, part unspecified, without perforation or abscess without bleeding: Secondary | ICD-10-CM | POA: Diagnosis not present

## 2020-05-09 DIAGNOSIS — K649 Unspecified hemorrhoids: Secondary | ICD-10-CM | POA: Diagnosis not present

## 2020-05-09 DIAGNOSIS — Z86010 Personal history of colonic polyps: Secondary | ICD-10-CM | POA: Diagnosis not present

## 2020-05-09 DIAGNOSIS — Z800 Family history of malignant neoplasm of digestive organs: Secondary | ICD-10-CM | POA: Diagnosis not present

## 2020-05-09 DIAGNOSIS — Z85038 Personal history of other malignant neoplasm of large intestine: Secondary | ICD-10-CM | POA: Diagnosis not present

## 2020-05-09 DIAGNOSIS — K5730 Diverticulosis of large intestine without perforation or abscess without bleeding: Secondary | ICD-10-CM | POA: Diagnosis not present

## 2020-05-15 DIAGNOSIS — C61 Malignant neoplasm of prostate: Secondary | ICD-10-CM | POA: Diagnosis not present

## 2020-05-15 DIAGNOSIS — R350 Frequency of micturition: Secondary | ICD-10-CM | POA: Diagnosis not present

## 2020-05-15 DIAGNOSIS — Z6825 Body mass index (BMI) 25.0-25.9, adult: Secondary | ICD-10-CM | POA: Diagnosis not present

## 2020-05-15 DIAGNOSIS — N5235 Erectile dysfunction following radiation therapy: Secondary | ICD-10-CM | POA: Diagnosis not present

## 2020-05-16 DIAGNOSIS — Z6824 Body mass index (BMI) 24.0-24.9, adult: Secondary | ICD-10-CM | POA: Diagnosis not present

## 2020-05-16 DIAGNOSIS — R1030 Lower abdominal pain, unspecified: Secondary | ICD-10-CM | POA: Diagnosis not present

## 2020-07-01 DIAGNOSIS — N23 Unspecified renal colic: Secondary | ICD-10-CM | POA: Diagnosis not present

## 2020-07-01 DIAGNOSIS — K5730 Diverticulosis of large intestine without perforation or abscess without bleeding: Secondary | ICD-10-CM | POA: Diagnosis not present

## 2020-07-01 DIAGNOSIS — N132 Hydronephrosis with renal and ureteral calculous obstruction: Secondary | ICD-10-CM | POA: Diagnosis not present

## 2020-07-01 DIAGNOSIS — Z87891 Personal history of nicotine dependence: Secondary | ICD-10-CM | POA: Diagnosis not present

## 2020-07-01 DIAGNOSIS — N201 Calculus of ureter: Secondary | ICD-10-CM | POA: Diagnosis not present

## 2020-09-06 DIAGNOSIS — I739 Peripheral vascular disease, unspecified: Secondary | ICD-10-CM | POA: Diagnosis not present

## 2020-09-06 DIAGNOSIS — E782 Mixed hyperlipidemia: Secondary | ICD-10-CM | POA: Diagnosis not present

## 2020-09-06 DIAGNOSIS — N5235 Erectile dysfunction following radiation therapy: Secondary | ICD-10-CM | POA: Diagnosis not present

## 2020-09-06 DIAGNOSIS — Z6824 Body mass index (BMI) 24.0-24.9, adult: Secondary | ICD-10-CM | POA: Diagnosis not present

## 2020-09-06 DIAGNOSIS — F5101 Primary insomnia: Secondary | ICD-10-CM | POA: Diagnosis not present

## 2020-09-06 DIAGNOSIS — Z0000 Encounter for general adult medical examination without abnormal findings: Secondary | ICD-10-CM | POA: Diagnosis not present

## 2020-09-06 DIAGNOSIS — C61 Malignant neoplasm of prostate: Secondary | ICD-10-CM | POA: Diagnosis not present

## 2020-09-06 DIAGNOSIS — I700 Atherosclerosis of aorta: Secondary | ICD-10-CM | POA: Diagnosis not present

## 2020-09-06 DIAGNOSIS — Z23 Encounter for immunization: Secondary | ICD-10-CM | POA: Diagnosis not present

## 2020-09-06 DIAGNOSIS — R252 Cramp and spasm: Secondary | ICD-10-CM | POA: Diagnosis not present

## 2020-09-12 DIAGNOSIS — Z08 Encounter for follow-up examination after completed treatment for malignant neoplasm: Secondary | ICD-10-CM | POA: Diagnosis not present

## 2020-09-12 DIAGNOSIS — L821 Other seborrheic keratosis: Secondary | ICD-10-CM | POA: Diagnosis not present

## 2020-09-12 DIAGNOSIS — Z85820 Personal history of malignant melanoma of skin: Secondary | ICD-10-CM | POA: Diagnosis not present

## 2020-09-12 DIAGNOSIS — D2272 Melanocytic nevi of left lower limb, including hip: Secondary | ICD-10-CM | POA: Diagnosis not present

## 2020-09-12 DIAGNOSIS — L570 Actinic keratosis: Secondary | ICD-10-CM | POA: Diagnosis not present

## 2020-09-12 DIAGNOSIS — L814 Other melanin hyperpigmentation: Secondary | ICD-10-CM | POA: Diagnosis not present

## 2020-09-12 DIAGNOSIS — Z85828 Personal history of other malignant neoplasm of skin: Secondary | ICD-10-CM | POA: Diagnosis not present

## 2020-09-12 DIAGNOSIS — D225 Melanocytic nevi of trunk: Secondary | ICD-10-CM | POA: Diagnosis not present

## 2020-09-12 DIAGNOSIS — D2262 Melanocytic nevi of left upper limb, including shoulder: Secondary | ICD-10-CM | POA: Diagnosis not present

## 2020-09-12 DIAGNOSIS — D2271 Melanocytic nevi of right lower limb, including hip: Secondary | ICD-10-CM | POA: Diagnosis not present

## 2020-11-14 DIAGNOSIS — Z23 Encounter for immunization: Secondary | ICD-10-CM | POA: Diagnosis not present
